# Patient Record
Sex: Female | Born: 1990 | Race: White | Hispanic: No | Marital: Married | Smoking: Never smoker
Health system: Southern US, Community
[De-identification: ages and names within clinical notes are randomized; demographics above are authoritative.]

## PROBLEM LIST (undated history)

## (undated) ENCOUNTER — Inpatient Hospital Stay (HOSPITAL_COMMUNITY): Payer: Self-pay

## (undated) DIAGNOSIS — N80109 Endometriosis of ovary, unspecified side, unspecified depth: Secondary | ICD-10-CM

## (undated) DIAGNOSIS — Z87440 Personal history of urinary (tract) infections: Secondary | ICD-10-CM

## (undated) DIAGNOSIS — T8859XA Other complications of anesthesia, initial encounter: Secondary | ICD-10-CM

## (undated) DIAGNOSIS — Z8709 Personal history of other diseases of the respiratory system: Secondary | ICD-10-CM

## (undated) DIAGNOSIS — Z87442 Personal history of urinary calculi: Secondary | ICD-10-CM

## (undated) DIAGNOSIS — Z8619 Personal history of other infectious and parasitic diseases: Secondary | ICD-10-CM

## (undated) DIAGNOSIS — F419 Anxiety disorder, unspecified: Secondary | ICD-10-CM

## (undated) DIAGNOSIS — N841 Polyp of cervix uteri: Secondary | ICD-10-CM

## (undated) DIAGNOSIS — Z9889 Other specified postprocedural states: Secondary | ICD-10-CM

## (undated) DIAGNOSIS — J45909 Unspecified asthma, uncomplicated: Secondary | ICD-10-CM

## (undated) DIAGNOSIS — K589 Irritable bowel syndrome without diarrhea: Secondary | ICD-10-CM

## (undated) DIAGNOSIS — Z8719 Personal history of other diseases of the digestive system: Secondary | ICD-10-CM

## (undated) DIAGNOSIS — R87629 Unspecified abnormal cytological findings in specimens from vagina: Secondary | ICD-10-CM

## (undated) DIAGNOSIS — N809 Endometriosis, unspecified: Secondary | ICD-10-CM

## (undated) DIAGNOSIS — T4145XA Adverse effect of unspecified anesthetic, initial encounter: Secondary | ICD-10-CM

## (undated) DIAGNOSIS — Q6210 Congenital occlusion of ureter, unspecified: Secondary | ICD-10-CM

## (undated) DIAGNOSIS — Z87448 Personal history of other diseases of urinary system: Secondary | ICD-10-CM

## (undated) DIAGNOSIS — N801 Endometriosis of ovary: Secondary | ICD-10-CM

## (undated) DIAGNOSIS — R112 Nausea with vomiting, unspecified: Secondary | ICD-10-CM

## (undated) HISTORY — DX: Personal history of other diseases of urinary system: Z87.448

## (undated) HISTORY — DX: Personal history of urinary calculi: Z87.442

## (undated) HISTORY — DX: Congenital occlusion of ureter, unspecified: Q62.10

## (undated) HISTORY — DX: Irritable bowel syndrome without diarrhea: K58.9

## (undated) HISTORY — DX: Personal history of other diseases of the digestive system: Z87.19

## (undated) HISTORY — DX: Endometriosis of ovary, unspecified side, unspecified depth: N80.109

## (undated) HISTORY — DX: Endometriosis of ovary: N80.1

## (undated) HISTORY — PX: DIAGNOSTIC LAPAROSCOPY: SUR761

## (undated) HISTORY — DX: Polyp of cervix uteri: N84.1

## (undated) HISTORY — PX: KIDNEY SURGERY: SHX687

## (undated) HISTORY — DX: Personal history of other diseases of the respiratory system: Z87.09

## (undated) HISTORY — DX: Personal history of urinary (tract) infections: Z87.440

## (undated) HISTORY — DX: Personal history of other infectious and parasitic diseases: Z86.19

## (undated) HISTORY — DX: Unspecified abnormal cytological findings in specimens from vagina: R87.629

---
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
## 2009-04-15 HISTORY — PX: APPENDECTOMY: SHX54

## 2009-04-15 HISTORY — PX: ABLATION ON ENDOMETRIOSIS: SHX5787

## 2013-02-16 ENCOUNTER — Ambulatory Visit (INDEPENDENT_AMBULATORY_CARE_PROVIDER_SITE_OTHER): Payer: Managed Care, Other (non HMO) | Admitting: Family Medicine

## 2013-02-16 ENCOUNTER — Encounter: Payer: Self-pay | Admitting: Family Medicine

## 2013-02-16 VITALS — BP 112/62 | HR 87 | Temp 98.9°F | Ht 65.0 in | Wt 133.5 lb

## 2013-02-16 DIAGNOSIS — K589 Irritable bowel syndrome without diarrhea: Secondary | ICD-10-CM | POA: Insufficient documentation

## 2013-02-16 DIAGNOSIS — Q613 Polycystic kidney, unspecified: Secondary | ICD-10-CM

## 2013-02-16 DIAGNOSIS — N200 Calculus of kidney: Secondary | ICD-10-CM | POA: Insufficient documentation

## 2013-02-16 DIAGNOSIS — N809 Endometriosis, unspecified: Secondary | ICD-10-CM

## 2013-02-16 DIAGNOSIS — R12 Heartburn: Secondary | ICD-10-CM | POA: Insufficient documentation

## 2013-02-16 DIAGNOSIS — N390 Urinary tract infection, site not specified: Secondary | ICD-10-CM

## 2013-02-16 NOTE — Patient Instructions (Signed)
Please send for last note from gyn (Dr Angeline Slim) and nephrology (Dr Earlene Plater)- both at [facility]  Call your nephrologist - ask about heartburn treatment ? Zantac  Watch diet - see the handout  Keep drinking lots of water

## 2013-02-16 NOTE — Progress Notes (Signed)
Subjective:    Patient ID: [name] [name], female    DOB: Jan 04, 1991, 22 y.o.   MRN: [id number]  HPI Here to establish for primary care  She lives across the street   Grew up in [place]  Has endometriosis- surgery 3 years ago (GYN in [place])- for endometriosis on her appendix / bladder/ovaries - has been improved since then  Did lupron/ depot for a while - had to go through menopause symptoms for a while  Then IUD - took it out last week  Uses the nuva ring - just started using it continuously   Mother had uterine cancer that spread to the ovaries   Has polycystic kidney disease- she was born with it  Many surgeries for this  Monitoring closely  Lot of kidney stones  (? Calcium) -now a new development - with repeated ureter blockage  One kidney does not fxn well (it never had) - the L side she thinks  R kidney functions normally as long as she does not get hydronephrosis (has a narrow ureter) She does have frequent utis -- her nephrologist keeps track of this - treated with amox  She "does not respond" to septra  Last couple of times - bacteria was staph   She drinks mostly water/ avoids other beverages/ urinates before and after intercourse / wipes front to back / (occ she holds urine too long)  Has baseline urgency and frequency   Also tends to have bad constipation - and this confuses her in terms of urinary symptoms  She was previously on amitiza - did not help  Usually diet makes the biggest difference -has seen a dietician    Does not have a urologist - just a nephrologist   Pap smear was in 2013 fall -will get one soon   Thinks she had a tetanus shot in college - 2009  Flu vaccine - had one 2 months ago   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 What she could take with renal issues  Tried zantac otc twice     There are no active problems to display for this patient.  Past Medical History  Diagnosis Date  . History of asthma     mild  . History of chicken pox   . History of hay fever   . History of kidney disease   . History of kidney stones   . History of UTI    Past Surgical History  Procedure Laterality Date  . Appendectomy  2011  . Ablation on endometriosis  2011  . Kidney surgery  1998, 2007, 2011    Blockage Removal  . Shoulder surgery  2007   History  Substance Use Topics  . Smoking status: Never Smoker   . Smokeless tobacco: Not on file  . Alcohol Use: Yes     Comment: wine -occ   Family History  Problem Relation Age of Onset  . Ovarian cancer Mother   . Uterine cancer Mother   . Stroke Father   . High blood pressure Father   . Sudden death Father   . Diabetes Maternal Grandmother    No Known Allergies No current outpatient prescriptions on file prior to visit.  No current facility-administered medications on file prior to visit.    Review of Systems Review of Systems  Constitutional: Negative for fever, appetite change, fatigue and unexpected weight change.  Eyes: Negative for pain and visual disturbance.  ent pos for pimple in nostril for which she is using bactroban ointment Respiratory: Negative for cough and shortness of breath.   Cardiovascular: Negative for cp or palpitations    Gastrointestinal: Negative for nausea, diarrhea and pos for constipation .pos for heartburn last night  Genitourinary: Negative for urgency and frequency. (but often has it) Skin: Negative for pallor or rash   Neurological: Negative for weakness, light-headedness, numbness and headaches.  Hematological: Negative for adenopathy. Does not bruise/bleed easily.  Psychiatric/Behavioral: Negative for dysphoric mood. The patient is not nervous/anxious.         Objective:   Physical Exam   Constitutional: She appears well-developed and well-nourished. No distress.  HENT:  Head: Normocephalic and atraumatic.  Right Ear: External ear normal.  Left Ear: External ear normal.  Nose: Nose normal.  Mouth/Throat: Oropharynx is clear and moist.  Small lesion in R nostril -not bleeding   Eyes: Conjunctivae and EOM are normal. Pupils are equal, round, and reactive to light. Right eye exhibits no discharge. Left eye exhibits no discharge. No scleral icterus.  Neck: Normal range of motion. Neck supple. No JVD present. No thyromegaly present.  Cardiovascular: Normal rate, regular rhythm, normal heart sounds and intact distal pulses.  Exam reveals no gallop.   Pulmonary/Chest: Effort normal and breath sounds normal. No respiratory distress. She has no wheezes. She has no rales.  Abdominal: Soft. Bowel sounds are normal. She exhibits no distension and no mass. There is no tenderness.  No cvs tenderness  Musculoskeletal: She exhibits no edema and no tenderness.  Lymphadenopathy:    She has no cervical adenopathy.  Neurological: She is alert. She has normal reflexes. No cranial nerve deficit. She exhibits normal muscle tone. Coordination normal.  Skin: Skin is warm and dry. No rash noted. No erythema. No pallor.  Psychiatric: She has a normal mood and affect.          Assessment & Plan:

## 2013-02-17 NOTE — Assessment & Plan Note (Signed)
Followed by nephrology-often tx with amox She has a cx pending now

## 2013-02-17 NOTE — Assessment & Plan Note (Signed)
I suspect zantac would be relatively safe but she will check with her renal doctor Diet given Will try not to eat late

## 2013-02-17 NOTE — Assessment & Plan Note (Signed)
Followed by gyn in [place] Sent for note  On nuva ring now

## 2013-02-17 NOTE — Assessment & Plan Note (Signed)
With narrow ureter and many stents  Cared for by nephrology

## 2013-02-17 NOTE — Assessment & Plan Note (Signed)
Currently stable  Hx of stones also  Followed closely by renal in [facility]

## 2013-02-17 NOTE — Assessment & Plan Note (Signed)
Constipation predominant -prev on amitiza No imp with miralax Stressed fluids and fiber ? If candidate for lizness in future in light of kidney issues

## 2013-08-04 ENCOUNTER — Emergency Department: Payer: Self-pay | Admitting: Internal Medicine

## 2013-08-04 LAB — URINALYSIS, COMPLETE
Bacteria: NONE SEEN
Bilirubin,UR: NEGATIVE
Glucose,UR: NEGATIVE mg/dL (ref 0–75)
Ketone: NEGATIVE
LEUKOCYTE ESTERASE: NEGATIVE
Nitrite: NEGATIVE
PH: 5 (ref 4.5–8.0)
Protein: NEGATIVE
RBC,UR: 2 /HPF (ref 0–5)
Specific Gravity: 1.012 (ref 1.003–1.030)
Squamous Epithelial: NONE SEEN

## 2013-08-04 LAB — COMPREHENSIVE METABOLIC PANEL
ALT: 26 U/L (ref 12–78)
Albumin: 4.7 g/dL (ref 3.4–5.0)
Alkaline Phosphatase: 61 U/L
Anion Gap: 4 — ABNORMAL LOW (ref 7–16)
BUN: 13 mg/dL (ref 7–18)
Bilirubin,Total: 0.8 mg/dL (ref 0.2–1.0)
CALCIUM: 9 mg/dL (ref 8.5–10.1)
CO2: 27 mmol/L (ref 21–32)
CREATININE: 0.59 mg/dL — AB (ref 0.60–1.30)
Chloride: 107 mmol/L (ref 98–107)
EGFR (African American): >60
EGFR (Non-African Amer.): >60
Glucose: 105 mg/dL — ABNORMAL HIGH (ref 65–99)
OSMOLALITY: 276 (ref 275–301)
Potassium: 4.4 mmol/L (ref 3.5–5.1)
SGOT(AST): 24 U/L (ref 15–37)
Sodium: 138 mmol/L (ref 136–145)
TOTAL PROTEIN: 8.1 g/dL (ref 6.4–8.2)

## 2013-08-04 LAB — CBC WITH DIFFERENTIAL/PLATELET
BASOS PCT: 0.5 %
Basophil #: 0 10*3/uL (ref 0.0–0.1)
EOS ABS: 0 10*3/uL (ref 0.0–0.7)
Eosinophil %: 0.7 %
HCT: 42.7 % (ref 35.0–47.0)
HGB: 14.5 g/dL (ref 12.0–16.0)
Lymphocyte #: 1.1 10*3/uL (ref 1.0–3.6)
Lymphocyte %: 19.9 %
MCH: 32.7 pg (ref 26.0–34.0)
MCHC: 34 g/dL (ref 32.0–36.0)
MCV: 96 fL (ref 80–100)
Monocyte #: 0.3 x10 3/mm (ref 0.2–0.9)
Monocyte %: 5.9 %
NEUTROS PCT: 73 %
Neutrophil #: 4.2 10*3/uL (ref 1.4–6.5)
PLATELETS: 174 10*3/uL (ref 150–440)
RBC: 4.43 10*6/uL (ref 3.80–5.20)
RDW: 11.7 % (ref 11.5–14.5)
WBC: 5.7 10*3/uL (ref 3.6–11.0)

## 2013-08-04 LAB — LIPASE, BLOOD: LIPASE: 161 U/L (ref 73–393)

## 2014-05-16 ENCOUNTER — Encounter: Payer: Self-pay | Admitting: Internal Medicine

## 2014-05-16 ENCOUNTER — Ambulatory Visit (INDEPENDENT_AMBULATORY_CARE_PROVIDER_SITE_OTHER): Payer: 59 | Admitting: Internal Medicine

## 2014-05-16 VITALS — BP 102/68 | HR 89 | Temp 98.3°F | Wt 137.0 lb

## 2014-05-16 DIAGNOSIS — J309 Allergic rhinitis, unspecified: Secondary | ICD-10-CM

## 2014-05-16 NOTE — Progress Notes (Addendum)
   Subjective:    Patient ID: [name] [name], female    DOB: 03/05/1991, 24 y.o.   MRN: [id number]  HPI   [name] [name] is a 24 year old female who presents today complaining of body aches, headaches and sinus issues. Symptoms began 4 days ago.  She denies congestion or sinus pain, but feels a burning sensation in her nose when breathing in. She is not blowing any mucous out of her nose. No cough, fever, but reports feeling fatigued.  She does report that she suffers from seasonal allergies and recently got a cat.  She has not taken anything for symptoms.      Review of Systems  Constitutional: Positive for fatigue. Negative for chills and diaphoresis.  HENT: Negative for congestion, ear pain, postnasal drip, rhinorrhea, sinus pressure and sore throat.   Respiratory: Negative for cough, choking, chest tightness, shortness of breath and wheezing.   Cardiovascular: Negative for chest pain, palpitations and leg swelling.  Gastrointestinal: Negative for nausea, abdominal pain and constipation.  Musculoskeletal: Positive for joint swelling. Negative for back pain and neck pain.  Skin: Negative for color change, pallor, rash and wound.  Neurological: Positive for headaches. Negative for dizziness, weakness and light-headedness.   Past Medical History  Diagnosis Date  . History of asthma     mild  . History of chicken pox   . History of hay fever   . History of kidney disease   . History of kidney stones   . History of UTI    Family History  Problem Relation Age of Onset  . Ovarian cancer Mother   . Uterine cancer Mother   . Stroke Father   . High blood pressure Father   . Sudden death Father   . Diabetes Maternal Grandmother    No current outpatient prescriptions on file prior to visit.   No current facility-administered medications on file prior to visit.        Objective:   Physical Exam  Constitutional: She is oriented to person, place, and time. She appears  well-developed and well-nourished.  HENT:  Head: Normocephalic and atraumatic.  Right Ear: External ear normal.  Left Ear: External ear normal.  Nose: Nose normal.  Mouth/Throat: Oropharynx is clear and moist. No oropharyngeal exudate.  Neck: Normal range of motion. Neck supple.  Cardiovascular: Normal rate, regular rhythm, normal heart sounds and intact distal pulses.   No murmur heard. Pulmonary/Chest: Effort normal and breath sounds normal. She has no wheezes.  Musculoskeletal: Normal range of motion.  Lymphadenopathy:    She has no cervical adenopathy.  Neurological: She is alert and oriented to person, place, and time.  Skin: Skin is warm and dry.  Psychiatric: She has a normal mood and affect.    BP 102/68 mmHg  Pulse 89  Temp(Src) 98.3 F (36.8 C) (Oral)  Wt 137 lb (62.143 kg)  SpO2 98%  LMP 05/09/2014       Assessment & Plan:   1. Allergic sinusitis - Advised patient to take zrytec OTC and Flonase nasal spray for symptoms.  Ibuprofen for headache.  Advised patient to call office if symptoms worsen or she develops fever.

## 2014-05-16 NOTE — Patient Instructions (Signed)

## 2014-05-16 NOTE — Progress Notes (Signed)
Pre visit review using our clinic review tool, if applicable. No additional management support is needed unless otherwise documented below in the visit note. 

## 2014-05-30 ENCOUNTER — Other Ambulatory Visit (HOSPITAL_COMMUNITY): Payer: Self-pay | Admitting: Physician Assistant

## 2014-05-30 DIAGNOSIS — N809 Endometriosis, unspecified: Secondary | ICD-10-CM

## 2014-06-03 ENCOUNTER — Other Ambulatory Visit (HOSPITAL_COMMUNITY): Payer: Self-pay | Admitting: Physician Assistant

## 2014-06-03 ENCOUNTER — Ambulatory Visit (HOSPITAL_COMMUNITY)
Admission: RE | Admit: 2014-06-03 | Discharge: 2014-06-03 | Disposition: A | Discharge status: Home / Self Care | Payer: 59 | Source: Ambulatory Visit | Attending: Physician Assistant | Admitting: Physician Assistant

## 2014-06-03 DIAGNOSIS — N809 Endometriosis, unspecified: Secondary | ICD-10-CM | POA: Diagnosis present

## 2014-06-03 DIAGNOSIS — N920 Excessive and frequent menstruation with regular cycle: Secondary | ICD-10-CM | POA: Diagnosis not present

## 2014-06-15 ENCOUNTER — Ambulatory Visit: Payer: 59 | Admitting: Internal Medicine

## 2014-07-28 ENCOUNTER — Emergency Department
Admit: 2014-07-28 | Disposition: A | Discharge status: Home / Self Care | Payer: Self-pay | Admitting: Emergency Medicine

## 2014-07-28 LAB — URINALYSIS, COMPLETE
Bilirubin,UR: NEGATIVE
Glucose,UR: NEGATIVE mg/dL (ref 0–75)
Ketone: NEGATIVE
LEUKOCYTE ESTERASE: NEGATIVE
Nitrite: NEGATIVE
PROTEIN: NEGATIVE
Ph: 8 (ref 4.5–8.0)
SPECIFIC GRAVITY: 1.012 (ref 1.003–1.030)

## 2014-10-04 ENCOUNTER — Encounter: Payer: Self-pay | Admitting: Family Medicine

## 2014-10-04 ENCOUNTER — Ambulatory Visit (INDEPENDENT_AMBULATORY_CARE_PROVIDER_SITE_OTHER): Payer: 59 | Admitting: Family Medicine

## 2014-10-04 VITALS — BP 98/66 | HR 71 | Temp 98.8°F | Ht 65.0 in | Wt 140.8 lb

## 2014-10-04 DIAGNOSIS — N390 Urinary tract infection, site not specified: Secondary | ICD-10-CM | POA: Diagnosis not present

## 2014-10-04 DIAGNOSIS — J069 Acute upper respiratory infection, unspecified: Secondary | ICD-10-CM

## 2014-10-04 DIAGNOSIS — R350 Frequency of micturition: Secondary | ICD-10-CM | POA: Insufficient documentation

## 2014-10-04 DIAGNOSIS — B9789 Other viral agents as the cause of diseases classified elsewhere: Principal | ICD-10-CM

## 2014-10-04 LAB — POCT URINALYSIS DIPSTICK
BILIRUBIN UA: NEGATIVE
Blood, UA: NEGATIVE
GLUCOSE UA: NEGATIVE
Ketones, UA: NEGATIVE
LEUKOCYTES UA: NEGATIVE
NITRITE UA: NEGATIVE
Protein, UA: NEGATIVE
Spec Grav, UA: >=1.03
UROBILINOGEN UA: 0.2
pH, UA: 6

## 2014-10-04 NOTE — Assessment & Plan Note (Signed)
Send urine for culture given pt history.

## 2014-10-04 NOTE — Progress Notes (Signed)
   Subjective:    Patient ID: [name] [name], female    DOB: 1991/01/20, 24 y.o.   MRN: [id number]  Cough This is a new problem. The current episode started in the past 7 days (in last 4 days). The problem has been gradually worsening. The cough is non-productive. Associated symptoms include ear congestion, headaches, nasal congestion, postnasal drip, rhinorrhea and a sore throat. Pertinent negatives include no chills, ear pain, fever, shortness of breath or wheezing. Associated symptoms comments: Green nasal discharge.  sinus pressure,  Left upper tooth pain. The symptoms are aggravated by lying down. Risk factors: nonsmoker. Treatments tried: nyquil. Her past medical history is significant for asthma and environmental allergies. There is no history of bronchiectasis, COPD, emphysema or pneumonia. mild intermittant asthma as child   She has polycystic kidney disease, bladder tenderness and frequent UTIs.  She has been having increased frequency and urgency in last week. IN last few days she has mild dysuria.  No fever. No flank pain.   Some bladder tenderness.  She has Bactrim from urologist to use if needed.  She request UA and culture today.    Review of Systems  Constitutional: Negative for fever and chills.  HENT: Positive for postnasal drip, rhinorrhea and sore throat. Negative for ear pain.   Respiratory: Positive for cough. Negative for shortness of breath and wheezing.   Musculoskeletal: Positive for arthralgias.  Allergic/Immunologic: Positive for environmental allergies.  Neurological: Positive for headaches.       Objective:   Physical Exam  Constitutional: Vital signs are normal. She appears well-developed and well-nourished. She is cooperative.  Non-toxic appearance. She does not appear ill. No distress.  HENT:  Head: Normocephalic.  Right Ear: Hearing, tympanic membrane, external ear and ear canal normal. Tympanic membrane is not erythematous, not retracted and not  bulging.  Left Ear: Hearing, tympanic membrane, external ear and ear canal normal. Tympanic membrane is not erythematous, not retracted and not bulging.  Nose: Mucosal edema and rhinorrhea present. Right sinus exhibits no maxillary sinus tenderness and no frontal sinus tenderness. Left sinus exhibits no maxillary sinus tenderness and no frontal sinus tenderness.  Mouth/Throat: Uvula is midline, oropharynx is clear and moist and mucous membranes are normal.  Eyes: Conjunctivae, EOM and lids are normal. Pupils are equal, round, and reactive to light. Lids are everted and swept, no foreign bodies found.  Neck: Trachea normal and normal range of motion. Neck supple. Carotid bruit is not present. No thyroid mass and no thyromegaly present.  Cardiovascular: Normal rate, regular rhythm, S1 normal, S2 normal, normal heart sounds, intact distal pulses and normal pulses.  Exam reveals no gallop and no friction rub.   No murmur heard. Pulmonary/Chest: Effort normal and breath sounds normal. No tachypnea. No respiratory distress. She has no decreased breath sounds. She has no wheezes. She has no rhonchi. She has no rales.  Abdominal: Normal appearance and bowel sounds are normal. There is no tenderness. There is no CVA tenderness.  Neurological: She is alert.  Skin: Skin is warm, dry and intact. No rash noted.  Psychiatric: Her speech is normal and behavior is normal. Judgment normal. Her mood appears not anxious. Cognition and memory are normal. She does not exhibit a depressed mood.          Assessment & Plan:

## 2014-10-04 NOTE — Patient Instructions (Addendum)
Start  Nasal saline spray 2-3 times a day. Start mucinex Dm twice daily.  Start flonase 2 sprays per nostril.   Call if not improving in 7-10 days, or fever.  We will call with urine culture results.

## 2014-10-04 NOTE — Progress Notes (Signed)
Pre visit review using our clinic review tool, if applicable. No additional management support is needed unless otherwise documented below in the visit note. 

## 2014-10-04 NOTE — Addendum Note (Signed)
Addended by: Shon Millet on: 10/04/2014 08:44 AM   Modules accepted: Orders

## 2014-10-04 NOTE — Assessment & Plan Note (Signed)
No clear indication for antibiotics.  Treat with mucolytic, nasal saline and steroid.

## 2014-10-05 LAB — URINE CULTURE
Colony Count: NO GROWTH
Organism ID, Bacteria: NO GROWTH

## 2014-12-01 ENCOUNTER — Ambulatory Visit (INDEPENDENT_AMBULATORY_CARE_PROVIDER_SITE_OTHER): Payer: 59 | Admitting: Primary Care

## 2014-12-01 ENCOUNTER — Encounter: Payer: Self-pay | Admitting: Primary Care

## 2014-12-01 VITALS — BP 106/70 | HR 89 | Temp 98.1°F | Ht 65.0 in | Wt 138.8 lb

## 2014-12-01 DIAGNOSIS — J029 Acute pharyngitis, unspecified: Secondary | ICD-10-CM

## 2014-12-01 LAB — POCT RAPID STREP A (OFFICE): RAPID STREP A SCREEN: NEGATIVE

## 2014-12-01 NOTE — Addendum Note (Signed)
Addended by: Tawnya Crook on: 12/01/2014 02:45 PM   Modules accepted: Orders

## 2014-12-01 NOTE — Patient Instructions (Signed)
You did not test positive for strep.  Your symptoms are likely due to a virus which will improve steadily over the next several days.  Push intake of water and take tylenol for pain and further fevers.  It was a pleasure meeting you!

## 2014-12-01 NOTE — Progress Notes (Signed)
Pre visit review using our clinic review tool, if applicable. No additional management support is needed unless otherwise documented below in the visit note. 

## 2014-12-01 NOTE — Progress Notes (Signed)
Subjective:    Patient ID: [name] [name], female    DOB: 1991-01-26, 24 y.o.   MRN: [id number]  HPI  [name] [name] is a 24 year old female who presents today with a chief complaint of sore throat. Her throat has been sore for the past 3-4 days with worsening pain this morning. She also reports symptoms of dry nose and has bumps in her nose. She was diagnosed with MRSA in her nose. She's been using saline nasal spray and applying medicated ointment to her nares. Now she's feeling better. She's also [redacted] weeks pregnant. She was exposed to two women who tested positive for strep.  Review of Systems  Constitutional: Negative for chills.       Low grade fever 1 week ago, dissipated that day.  HENT: Positive for postnasal drip and sore throat. Negative for congestion and sinus pressure.   Respiratory: Negative for cough and shortness of breath.   Cardiovascular: Negative for chest pain.  Musculoskeletal: Negative for myalgias.       Past Medical History  Diagnosis Date  . History of asthma     mild  . History of chicken pox   . History of hay fever   . History of kidney disease   . History of kidney stones   . History of UTI     Social History   Social History  . Marital Status: Married    Spouse Name: N/A  . Number of Children: N/A  . Years of Education: N/A   Occupational History  . Not on file.   Social History Main Topics  . Smoking status: Never Smoker   . Smokeless tobacco: Not on file  . Alcohol Use: 0.0 oz/week    0 Standard drinks or equivalent per week     Comment: wine -occ  . Drug Use: No  . Sexual Activity: Not on file   Other Topics Concern  . Not on file   Social History Narrative    Past Surgical History  Procedure Laterality Date  . Appendectomy  2011  . Ablation on endometriosis  2011  . Kidney surgery  1998, 2007, 2011    Blockage Removal  . Shoulder surgery  2007    Family History  Problem Relation Age of Onset  . Ovarian cancer Mother    . Uterine cancer Mother   . Stroke Father   . High blood pressure Father   . Sudden death Father   . Diabetes Maternal Grandmother     No Known Allergies  Current Outpatient Prescriptions on File Prior to Visit  Medication Sig Dispense Refill  . Prenatal Vit-Fe Fumarate-FA (PRENATAL MULTIVITAMIN) TABS tablet Take 1 tablet by mouth daily at 12 noon.     No current facility-administered medications on file prior to visit.    BP 106/70 mmHg  Pulse 89  Temp(Src) 98.1 F (36.7 C) (Oral)  Ht  (1.651 m)  Wt 138 lb 12.8 oz (62.959 kg)  BMI 23.10 kg/m2  SpO2 98%  LMP 10/03/2014    Objective:   Physical Exam  Constitutional: She appears well-nourished. She does not appear ill.  HENT:  Right Ear: Tympanic membrane and ear canal normal.  Left Ear: Tympanic membrane and ear canal normal.  Nose: Right sinus exhibits no maxillary sinus tenderness and no frontal sinus tenderness. Left sinus exhibits no maxillary sinus tenderness and no frontal sinus tenderness.  Mouth/Throat: Posterior oropharyngeal erythema present. No oropharyngeal exudate or posterior oropharyngeal edema.  Eyes: Conjunctivae  are normal. Pupils are equal, round, and reactive to light.  Neck: Neck supple.  Cardiovascular: Normal rate and regular rhythm.   Pulmonary/Chest: Effort normal and breath sounds normal.  Lymphadenopathy:    She has no cervical adenopathy.  Skin: Skin is warm and dry.          Assessment & Plan:  Sore throat:  Present for 3-4 days, worsening pain this morning, now feeling better. Currently [redacted] weeks pregnant Using nasal saline spray and ointment. 1 low grade fever last week. Rapid strep: Negative Push fluids. Tylenol for pain and any further fever.

## 2014-12-11 LAB — OB RESULTS CONSOLE RPR: RPR: NONREACTIVE

## 2014-12-11 LAB — OB RESULTS CONSOLE GC/CHLAMYDIA
CHLAMYDIA, DNA PROBE: NEGATIVE
GC PROBE AMP, GENITAL: NEGATIVE

## 2014-12-11 LAB — OB RESULTS CONSOLE ANTIBODY SCREEN: ANTIBODY SCREEN: NEGATIVE

## 2014-12-11 LAB — OB RESULTS CONSOLE ABO/RH: RH Type: NEGATIVE

## 2014-12-11 LAB — OB RESULTS CONSOLE HEPATITIS B SURFACE ANTIGEN: HEP B S AG: NEGATIVE

## 2014-12-11 LAB — OB RESULTS CONSOLE HIV ANTIBODY (ROUTINE TESTING): HIV: NONREACTIVE

## 2014-12-11 LAB — OB RESULTS CONSOLE RUBELLA ANTIBODY, IGM: Rubella: NON-IMMUNE/NOT IMMUNE

## 2015-03-31 ENCOUNTER — Encounter: Payer: Self-pay | Admitting: Family Medicine

## 2015-03-31 ENCOUNTER — Ambulatory Visit (INDEPENDENT_AMBULATORY_CARE_PROVIDER_SITE_OTHER): Payer: 59 | Admitting: Family Medicine

## 2015-03-31 VITALS — BP 110/60 | HR 92 | Temp 97.8°F | Wt 152.5 lb

## 2015-03-31 DIAGNOSIS — B9789 Other viral agents as the cause of diseases classified elsewhere: Principal | ICD-10-CM

## 2015-03-31 DIAGNOSIS — J069 Acute upper respiratory infection, unspecified: Secondary | ICD-10-CM | POA: Diagnosis not present

## 2015-03-31 MED ORDER — AMOXICILLIN 250 MG/5ML PO SUSR: 500.0000 mg | Freq: Three times a day (TID) | ORAL | Status: DC

## 2015-03-31 NOTE — Patient Instructions (Signed)
Continue the robitussin  Tylenol if needed Drink fluids Breathe steam for congestion  Zyrtec 5 mg daily is ok for runny nose if needed  If you get sudden increase in ear pain and fever - take the amoxicillin and let us know    Update if not starting to improve in a week or if worsening

## 2015-03-31 NOTE — Assessment & Plan Note (Signed)
In pregnant pt - hx of OM Has ETD on the R -no infx   (px amox in case she gets sudden worse ear pain over the wkend)  Disc symptomatic care - see instructions on AVS  Update if not starting to improve in a week or if worsening   Reassuring exam

## 2015-03-31 NOTE — Progress Notes (Signed)
Pre visit review using our clinic review tool, if applicable. No additional management support is needed unless otherwise documented below in the visit note. 

## 2015-03-31 NOTE — Progress Notes (Signed)
Subjective:    Patient ID: [name] [name], female    DOB: 1990-05-20, 24 y.o.   MRN: [id number]  HPI Here with uri symptoms   2 -3 days Congestion /runny nose Throat and ear pain   Working long hours   Has had some chills and aches  Suspects low grade temp -unsure - just felt like it   Took 1 tylenol for ear pain  Robitussin DM -- suggested by OB   Is pregnant = due in March  Patient Active Problem List   Diagnosis Date Noted  . Viral URI with cough 10/04/2014  . Urinary frequency 10/04/2014  . Heartburn 02/16/2013  . Polycystic kidney 02/16/2013  . Kidney stones 02/16/2013  . Frequent UTI 02/16/2013  . IBS (irritable bowel syndrome) 02/16/2013  . Endometriosis 02/16/2013   Past Medical History  Diagnosis Date  . History of asthma     mild  . History of chicken pox   . History of hay fever   . History of kidney disease   . History of kidney stones   . History of UTI    Past Surgical History  Procedure Laterality Date  . Appendectomy  2011  . Ablation on endometriosis  2011  . Kidney surgery  1998, 2007, 2011    Blockage Removal  . Shoulder surgery  2007   Social History  Substance Use Topics  . Smoking status: Never Smoker   . Smokeless tobacco: None  . Alcohol Use: 0.0 oz/week    0 Standard drinks or equivalent per week     Comment: wine -occ   Family History  Problem Relation Age of Onset  . Ovarian cancer Mother   . Uterine cancer Mother   . Stroke Father   . High blood pressure Father   . Sudden death Father   . Diabetes Maternal Grandmother    No Known Allergies Current Outpatient Prescriptions on File Prior to Visit  Medication Sig Dispense Refill  . Prenatal Vit-Fe Fumarate-FA (PRENATAL MULTIVITAMIN) TABS tablet Take 1 tablet by mouth daily at 12 noon.     No current facility-administered medications on file prior to visit.    Review of Systems Review of Systems  Constitutional: Negative for fever, appetite change, fatigue and  unexpected weight change. pos for pregnancy ENT pos for cong/rhinorrhea and ear fullness Eyes: Negative for pain and visual disturbance.  Respiratory: Negative for wheeze and shortness of breath.   Cardiovascular: Negative for cp or palpitations    Gastrointestinal: Negative for nausea, diarrhea and constipation.  Genitourinary: Negative for urgency and frequency.  Skin: Negative for pallor or rash   Neurological: Negative for weakness, light-headedness, numbness and headaches.  Hematological: Negative for adenopathy. Does not bruise/bleed easily.  Psychiatric/Behavioral: Negative for dysphoric mood. The patient is not nervous/anxious.         Objective:   Physical Exam  Constitutional: She appears well-developed and well-nourished. No distress.  HENT:  Head: Normocephalic and atraumatic.  Right Ear: External ear normal.  Left Ear: External ear normal.  Mouth/Throat: Oropharynx is clear and moist.  Nares are injected and congested  No sinus tenderness Clear rhinorrhea and post nasal drip  TMs are dull bilat   Eyes: Conjunctivae and EOM are normal. Pupils are equal, round, and reactive to light. Right eye exhibits no discharge. Left eye exhibits no discharge.  Neck: Normal range of motion. Neck supple.  Cardiovascular: Normal rate and normal heart sounds.   Pulmonary/Chest: Effort normal and breath sounds normal. No  respiratory distress. She has no wheezes. She has no rales. She exhibits no tenderness.  Lymphadenopathy:    She has no cervical adenopathy.  Neurological: She is alert.  Skin: Skin is warm and dry. No rash noted.  Psychiatric: She has a normal mood and affect.          Assessment & Plan:   Problem List Items Addressed This Visit      Respiratory   Viral URI with cough - Primary    In pregnant pt - hx of OM Has ETD on the R -no infx   (px amox in case she gets sudden worse ear pain over the wkend)  Disc symptomatic care - see instructions on AVS  Update if  not starting to improve in a week or if worsening   Reassuring exam

## 2015-05-19 ENCOUNTER — Encounter: Payer: Self-pay | Admitting: Primary Care

## 2015-05-19 ENCOUNTER — Ambulatory Visit (INDEPENDENT_AMBULATORY_CARE_PROVIDER_SITE_OTHER): Payer: 59 | Admitting: Primary Care

## 2015-05-19 VITALS — BP 120/72 | HR 81 | Temp 97.8°F | Ht 65.0 in | Wt 160.8 lb

## 2015-05-19 DIAGNOSIS — R05 Cough: Secondary | ICD-10-CM

## 2015-05-19 DIAGNOSIS — R059 Cough, unspecified: Secondary | ICD-10-CM

## 2015-05-19 MED ORDER — AMOXICILLIN 500 MG PO CAPS: 500.0000 mg | ORAL_CAPSULE | Freq: Two times a day (BID) | ORAL | Status: DC

## 2015-05-19 NOTE — Progress Notes (Signed)
Pre visit review using our clinic review tool, if applicable. No additional management support is needed unless otherwise documented below in the visit note. 

## 2015-05-19 NOTE — Progress Notes (Signed)
Subjective:    Patient ID: [name] [name], female    DOB: 1991/03/23, 25 y.o.   MRN: [id number]  HPI  Ms. [name] [name] is a 25 year old female who presents today with a chief complaint of cough. She also reports fevers, nasal congestion, sore throat, ear pain, chills. Her symptoms have been present for the past 7 days. This is the third time she's experienced these symptoms during her pregnancy. This morning she woke up with less congestion overall. She's taken tylenol and Robitussin DM with some improvement. Denies sick contacts.   Review of Systems  Constitutional: Positive for fever, chills and fatigue.  HENT: Positive for congestion, ear pain, postnasal drip and sinus pressure.   Respiratory: Positive for cough. Negative for shortness of breath and wheezing.   Cardiovascular: Negative for chest pain.       Past Medical History  Diagnosis Date  . History of asthma     mild  . History of chicken pox   . History of hay fever   . History of kidney disease   . History of kidney stones   . History of UTI     Social History   Social History  . Marital Status: Married    Spouse Name: N/A  . Number of Children: N/A  . Years of Education: N/A   Occupational History  . Not on file.   Social History Main Topics  . Smoking status: Never Smoker   . Smokeless tobacco: Not on file  . Alcohol Use: 0.0 oz/week    0 Standard drinks or equivalent per week     Comment: wine -occ  . Drug Use: No  . Sexual Activity: Not on file   Other Topics Concern  . Not on file   Social History Narrative    Past Surgical History  Procedure Laterality Date  . Appendectomy  2011  . Ablation on endometriosis  2011  . Kidney surgery  1998, 2007, 2011    Blockage Removal  . Shoulder surgery  2007    Family History  Problem Relation Age of Onset  . Ovarian cancer Mother   . Uterine cancer Mother   . Stroke Father   . High blood pressure Father   . Sudden death Father   . Diabetes  Maternal Grandmother     No Known Allergies  Current Outpatient Prescriptions on File Prior to Visit  Medication Sig Dispense Refill  . Prenatal Vit-Fe Fumarate-FA (PRENATAL MULTIVITAMIN) TABS tablet Take 1 tablet by mouth daily at 12 noon.     No current facility-administered medications on file prior to visit.    BP 120/72 mmHg  Pulse 81  Temp(Src) 97.8 F (36.6 C) (Oral)  Ht  (1.651 m)  Wt 160 lb 12.8 oz (72.938 kg)  BMI 26.76 kg/m2  SpO2 98%  LMP 10/03/2014    Objective:   Physical Exam  Constitutional: She appears well-nourished.  HENT:  Right Ear: Tympanic membrane and ear canal normal.  Left Ear: Tympanic membrane and ear canal normal.  Nose: Right sinus exhibits maxillary sinus tenderness. Right sinus exhibits no frontal sinus tenderness. Left sinus exhibits maxillary sinus tenderness. Left sinus exhibits no frontal sinus tenderness.  Mouth/Throat: Oropharynx is clear and moist.  Eyes: Conjunctivae are normal.  Neck: Neck supple.  Cardiovascular: Normal rate and regular rhythm.   Pulmonary/Chest: Effort normal and breath sounds normal. She has no wheezes. She has no rales.  Lymphadenopathy:    She has no cervical adenopathy.  Assessment & Plan:  URI:  Cough, fatigue, sore throat, ear pain x 7 days.  Starting to feel slightly improved from yesterday. Some improvement with OTC treatment. Exam with clear lungs, nasal mucosal edema, vitals stable. Suspect viral involvement at this point and will continue with supportive treatment. Zyrtec, Robitussin DM, Flonase, fluids. RX for Amoxil printed for patient to fill if no improvement.  Return precautions provided.

## 2015-05-19 NOTE — Patient Instructions (Signed)
Hang onto the Amoxicillin prescription and fill if you start to feel worse. Try to refrain from filling the antibiotic for several days if possible.  Start Zyrtec, continue Robitussin.  Nasal Congestion: Try using Flonase (fluticasone) nasal spray. Instill 2 sprays in each nostril once daily.   Increase consumption of water and rest.  It was a pleasure meeting you!  Upper Respiratory Infection, Adult Most upper respiratory infections (URIs) are a viral infection of the air passages leading to the lungs. A URI affects the nose, throat, and upper air passages. The most common type of URI is nasopharyngitis and is typically referred to as "the common cold." URIs run their course and usually go away on their own. Most of the time, a URI does not require medical attention, but sometimes a bacterial infection in the upper airways can follow a viral infection. This is called a secondary infection. Sinus and middle ear infections are common types of secondary upper respiratory infections. Bacterial pneumonia can also complicate a URI. A URI can worsen asthma and chronic obstructive pulmonary disease (COPD). Sometimes, these complications can require emergency medical care and may be life threatening.  CAUSES Almost all URIs are caused by viruses. A virus is a type of germ and can spread from one person to another.  RISKS FACTORS You may be at risk for a URI if:   You smoke.   You have chronic heart or lung disease.  You have a weakened defense (immune) system.   You are very young or very old.   You have nasal allergies or asthma.  You work in crowded or poorly ventilated areas.  You work in health care facilities or schools. SIGNS AND SYMPTOMS  Symptoms typically develop 2-3 days after you come in contact with a cold virus. Most viral URIs last 7-10 days. However, viral URIs from the influenza virus (flu virus) can last 14-18 days and are typically more severe. Symptoms may include:    Runny or stuffy (congested) nose.   Sneezing.   Cough.   Sore throat.   Headache.   Fatigue.   Fever.   Loss of appetite.   Pain in your forehead, behind your eyes, and over your cheekbones (sinus pain).  Muscle aches.  DIAGNOSIS  Your health care provider may diagnose a URI by:  Physical exam.  Tests to check that your symptoms are not due to another condition such as:  Strep throat.  Sinusitis.  Pneumonia.  Asthma. TREATMENT  A URI goes away on its own with time. It cannot be cured with medicines, but medicines may be prescribed or recommended to relieve symptoms. Medicines may help:  Reduce your fever.  Reduce your cough.  Relieve nasal congestion. HOME CARE INSTRUCTIONS   Take medicines only as directed by your health care provider.   Gargle warm saltwater or take cough drops to comfort your throat as directed by your health care provider.  Use a warm mist humidifier or inhale steam from a shower to increase air moisture. This may make it easier to breathe.  Drink enough fluid to keep your urine clear or pale yellow.   Eat soups and other clear broths and maintain good nutrition.   Rest as needed.   Return to work when your temperature has returned to normal or as your health care provider advises. You may need to stay home longer to avoid infecting others. You can also use a face mask and careful hand washing to prevent spread of the virus.  Increase the usage of your inhaler if you have asthma.   Do not use any tobacco products, including cigarettes, chewing tobacco, or electronic cigarettes. If you need help quitting, ask your health care provider. PREVENTION  The best way to protect yourself from getting a cold is to practice good hygiene.   Avoid oral or hand contact with people with cold symptoms.   Wash your hands often if contact occurs.  There is no clear evidence that vitamin C, vitamin E, echinacea, or exercise  reduces the chance of developing a cold. However, it is always recommended to get plenty of rest, exercise, and practice good nutrition.  SEEK MEDICAL CARE IF:   You are getting worse rather than better.   Your symptoms are not controlled by medicine.   You have chills.  You have worsening shortness of breath.  You have brown or red mucus.  You have yellow or brown nasal discharge.  You have pain in your face, especially when you bend forward.  You have a fever.  You have swollen neck glands.  You have pain while swallowing.  You have white areas in the back of your throat. SEEK IMMEDIATE MEDICAL CARE IF:   You have severe or persistent:  Headache.  Ear pain.  Sinus pain.  Chest pain.  You have chronic lung disease and any of the following:  Wheezing.  Prolonged cough.  Coughing up blood.  A change in your usual mucus.  You have a stiff neck.  You have changes in your:  Vision.  Hearing.  Thinking.  Mood. MAKE SURE YOU:   Understand these instructions.  Will watch your condition.  Will get help right away if you are not doing well or get worse.   This information is not intended to replace advice given to you by your health care provider. Make sure you discuss any questions you have with your health care provider.   Document Released: 09/25/2000 Document Revised: 08/16/2014 Document Reviewed: 07/07/2013 Elsevier Interactive Patient Education [employer]

## 2015-06-09 ENCOUNTER — Encounter (HOSPITAL_COMMUNITY): Payer: Self-pay | Admitting: *Deleted

## 2015-06-09 ENCOUNTER — Inpatient Hospital Stay (HOSPITAL_COMMUNITY)
Admission: AD | Admit: 2015-06-09 | Discharge: 2015-06-09 | Disposition: A | Discharge status: Home / Self Care | Payer: 59 | Source: Ambulatory Visit | Attending: Obstetrics & Gynecology | Admitting: Obstetrics & Gynecology

## 2015-06-09 DIAGNOSIS — Z87442 Personal history of urinary calculi: Secondary | ICD-10-CM | POA: Diagnosis not present

## 2015-06-09 DIAGNOSIS — K921 Melena: Secondary | ICD-10-CM | POA: Insufficient documentation

## 2015-06-09 DIAGNOSIS — O26893 Other specified pregnancy related conditions, third trimester: Secondary | ICD-10-CM | POA: Insufficient documentation

## 2015-06-09 DIAGNOSIS — Z79899 Other long term (current) drug therapy: Secondary | ICD-10-CM | POA: Diagnosis not present

## 2015-06-09 DIAGNOSIS — Z9889 Other specified postprocedural states: Secondary | ICD-10-CM | POA: Insufficient documentation

## 2015-06-09 DIAGNOSIS — O99613 Diseases of the digestive system complicating pregnancy, third trimester: Secondary | ICD-10-CM

## 2015-06-09 DIAGNOSIS — J45909 Unspecified asthma, uncomplicated: Secondary | ICD-10-CM | POA: Diagnosis not present

## 2015-06-09 DIAGNOSIS — K5900 Constipation, unspecified: Secondary | ICD-10-CM | POA: Insufficient documentation

## 2015-06-09 DIAGNOSIS — Z3A35 35 weeks gestation of pregnancy: Secondary | ICD-10-CM | POA: Diagnosis not present

## 2015-06-09 HISTORY — DX: Adverse effect of unspecified anesthetic, initial encounter: T41.45XA

## 2015-06-09 HISTORY — DX: Other specified postprocedural states: Z98.890

## 2015-06-09 HISTORY — DX: Other complications of anesthesia, initial encounter: T88.59XA

## 2015-06-09 HISTORY — DX: Endometriosis, unspecified: N80.9

## 2015-06-09 HISTORY — DX: Nausea with vomiting, unspecified: R11.2

## 2015-06-09 HISTORY — DX: Unspecified asthma, uncomplicated: J45.909

## 2015-06-09 MED ORDER — DOCUSATE SODIUM 100 MG PO CAPS: 100.0000 mg | ORAL_CAPSULE | Freq: Two times a day (BID) | ORAL | Status: DC

## 2015-06-09 NOTE — Discharge Instructions (Signed)
Constipation, Adult °Constipation is when a person has fewer than three bowel movements a week, has difficulty having a bowel movement, or has stools that are dry, hard, or larger than normal. As people grow older, constipation is more common. A low-fiber diet, not taking in enough fluids, and taking certain medicines may make constipation worse.  °CAUSES  °· Certain medicines, such as antidepressants, pain medicine, iron supplements, antacids, and water pills.   °· Certain diseases, such as diabetes, irritable bowel syndrome (IBS), thyroid disease, or depression.   °· Not drinking enough water.   °· Not eating enough fiber-rich foods.   °· Stress or travel.   °· Lack of physical activity or exercise.   °· Ignoring the urge to have a bowel movement.   °· Using laxatives too much.   °SIGNS AND SYMPTOMS  °· Having fewer than three bowel movements a week.   °· Straining to have a bowel movement.   °· Having stools that are hard, dry, or larger than normal.   °· Feeling full or bloated.   °· Pain in the lower abdomen.   °· Not feeling relief after having a bowel movement.   °DIAGNOSIS  °Your health care provider will take a medical history and perform a physical exam. Further testing may be done for severe constipation. Some tests may include: °· A barium enema X-ray to examine your rectum, colon, and, sometimes, your small intestine.   °· A sigmoidoscopy to examine your lower colon.   °· A colonoscopy to examine your entire colon. °TREATMENT  °Treatment will depend on the severity of your constipation and what is causing it. Some dietary treatments include drinking more fluids and eating more fiber-rich foods. Lifestyle treatments may include regular exercise. If these diet and lifestyle recommendations do not help, your health care provider may recommend taking over-the-counter laxative medicines to help you have bowel movements. Prescription medicines may be prescribed if over-the-counter medicines do not work.    °HOME CARE INSTRUCTIONS  °· Eat foods that have a lot of fiber, such as fruits, vegetables, whole grains, and beans. °· Limit foods high in fat and processed sugars, such as french fries, hamburgers, cookies, candies, and soda.   °· A fiber supplement may be added to your diet if you cannot get enough fiber from foods.   °· Drink enough fluids to keep your urine clear or pale yellow.   °· Exercise regularly or as directed by your health care provider.   °· Go to the restroom when you have the urge to go. Do not hold it.   °· Only take over-the-counter or prescription medicines as directed by your health care provider. Do not take other medicines for constipation without talking to your health care provider first.   °SEEK IMMEDIATE MEDICAL CARE IF:  °· You have bright red blood in your stool.   °· Your constipation lasts for more than 4 days or gets worse.   °· You have abdominal or rectal pain.   °· You have thin, pencil-like stools.   °· You have unexplained weight loss. °MAKE SURE YOU:  °· Understand these instructions. °· Will watch your condition. °· Will get help right away if you are not doing well or get worse. °  °This information is not intended to replace advice given to you by your health care provider. Make sure you discuss any questions you have with your health care provider. °  °Document Released: 12/29/2003 Document Revised: 04/22/2014 Document Reviewed: 01/11/2013 °Elsevier Interactive Patient Education ©2016 Elsevier Inc. ° °High-Fiber Diet °Fiber, also called dietary fiber, is a type of carbohydrate found in fruits, vegetables, whole grains, and   beans. A high-fiber diet can have many health benefits. Your health care provider may recommend a high-fiber diet to help: °· Prevent constipation. Fiber can make your bowel movements more regular. °· Lower your cholesterol. °· Relieve hemorrhoids, uncomplicated diverticulosis, or irritable bowel syndrome. °· Prevent overeating as part of a weight-loss  plan. °· Prevent heart disease, type 2 diabetes, and certain cancers. °WHAT IS MY PLAN? °The recommended daily intake of fiber includes: °· 38 grams for men under age 50. °· 30 grams for men over age 50. °· 25 grams for women under age 50. °· 21 grams for women over age 50. °You can get the recommended daily intake of dietary fiber by eating a variety of fruits, vegetables, grains, and beans. Your health care provider may also recommend a fiber supplement if it is not possible to get enough fiber through your diet. °WHAT DO I NEED TO KNOW ABOUT A HIGH-FIBER DIET? °· Fiber supplements have not been widely studied for their effectiveness, so it is better to get fiber through food sources. °· Always check the fiber content on the nutrition facts label of any prepackaged food. Look for foods that contain at least 5 grams of fiber per serving. °· Ask your dietitian if you have questions about specific foods that are related to your condition, especially if those foods are not listed in the following section. °· Increase your daily fiber consumption gradually. Increasing your intake of dietary fiber too quickly may cause bloating, cramping, or gas. °· Drink plenty of water. Water helps you to digest fiber. °WHAT FOODS CAN I EAT? °Grains °Whole-grain breads. Multigrain cereal. Oats and oatmeal. Brown rice. Barley. Bulgur wheat. Millet. Bran muffins. Popcorn. Rye wafer crackers. °Vegetables °Sweet potatoes. Spinach. Kale. Artichokes. Cabbage. Broccoli. Green peas. Carrots. Squash. °Fruits °Berries. Pears. Apples. Oranges. Avocados. Prunes and raisins. Dried figs. °Meats and Other Protein Sources °Navy, kidney, pinto, and soy beans. Split peas. Lentils. Nuts and seeds. °Dairy °Fiber-fortified yogurt. °Beverages °Fiber-fortified soy milk. Fiber-fortified orange juice. °Other °Fiber bars. °The items listed above may not be a complete list of recommended foods or beverages. Contact your dietitian for more options. °WHAT FOODS  ARE NOT RECOMMENDED? °Grains °White bread. Pasta made with refined flour. White rice. °Vegetables °Fried potatoes. Canned vegetables. Well-cooked vegetables.  °Fruits °Fruit juice. Cooked, strained fruit. °Meats and Other Protein Sources °Fatty cuts of meat. Fried poultry or fried fish. °Dairy °Milk. Yogurt. Cream cheese. Sour cream. °Beverages °Soft drinks. °Other °Cakes and pastries. Butter and oils. °The items listed above may not be a complete list of foods and beverages to avoid. Contact your dietitian for more information. °WHAT ARE SOME TIPS FOR INCLUDING HIGH-FIBER FOODS IN MY DIET? °· Eat a wide variety of high-fiber foods. °· Make sure that half of all grains consumed each day are whole grains. °· Replace breads and cereals made from refined flour or white flour with whole-grain breads and cereals. °· Replace white rice with brown rice, bulgur wheat, or millet. °· Start the day with a breakfast that is high in fiber, such as a cereal that contains at least 5 grams of fiber per serving. °· Use beans in place of meat in soups, salads, or pasta. °· Eat high-fiber snacks, such as berries, raw vegetables, nuts, or popcorn. °  °This information is not intended to replace advice given to you by your health care provider. Make sure you discuss any questions you have with your health care provider. °  °Document Released: 04/01/2005 Document Revised: 04/22/2014 Document   Reviewed: 09/14/2013 °Elsevier Interactive Patient Education ©2016 Elsevier Inc. ° °

## 2015-06-09 NOTE — MAU Note (Signed)
PT SAYS SHE HAS BLOOD IN STOOL - NOTICED AT 2300-- BLOOD  WITH MUCUS.     HAS BEEN VERY CONSTIPATED-   HAD FLEETS ENEMA  AT 1045-   POOR  RESULTS .   HAS HX OF CHRONIC CONSTIPATION.        ON 2-17-  WAS AT [facility]    WITH [facility]-     AND  [facility]-  GAVE HER ZOFRAN-    THEN  BECAME  MORE  CONSTIPATED           ON 2-20-  AT DR   Renaldo Fiddler    OFFICE -   STILL HAVING  [facility]-  BUT [facility] - CLOSED.

## 2015-06-09 NOTE — MAU Provider Note (Signed)
History     CSN: [id number]  Arrival date and time: 06/09/15 0125   First Provider Initiated Contact with Patient 06/09/15 0210      Chief Complaint  Patient presents with  . Constipation   HPI Comments: [name] [name] is a 25 y.o. G1P0 at [redacted]w[redacted]d who presents today with constipation and blood in her stool. She denies any VB or LOF. She confirms normal fetal movement. She states that she has dealt with constipation for several years. She states that she has been doing well, but then last week she had a stomach bug. She had N/V/D. She went to the ED, and was given zofran. Now she has been unable to have a BM this week.   Constipation This is a new problem. The current episode started yesterday. The problem is unchanged. Her stool frequency is 1 time per week or less. The stool is described as firm, formed and blood tinged. The patient is not on a high fiber diet. She does not exercise regularly. There has been adequate water intake. Associated symptoms include abdominal pain. Pertinent negatives include no diarrhea, fever or nausea. She has tried enemas for the symptoms. The treatment provided mild (one small BM after enema last night. Blood in stool. ) relief.     Past Medical History  Diagnosis Date  . History of asthma     mild  . History of chicken pox   . History of hay fever   . History of kidney disease   . History of kidney stones   . History of UTI     Past Surgical History  Procedure Laterality Date  . Appendectomy  2011  . Ablation on endometriosis  2011  . Kidney surgery  1998, 2007, 2011    Blockage Removal  . Shoulder surgery  2007    Family History  Problem Relation Age of Onset  . Ovarian cancer Mother   . Uterine cancer Mother   . Stroke Father   . High blood pressure Father   . Sudden death Father   . Diabetes Maternal Grandmother     Social History  Substance Use Topics  . Smoking status: Never Smoker   . Smokeless tobacco: None  . Alcohol  Use: 0.0 oz/week    0 Standard drinks or equivalent per week     Comment: wine -occ    Allergies: No Known Allergies  Prescriptions prior to admission  Medication Sig Dispense Refill Last Dose  . amoxicillin (AMOXIL) 500 MG capsule Take 1 capsule (500 mg total) by mouth 2 (two) times daily. 10 capsule 0   . Prenatal Vit-Fe Fumarate-FA (PRENATAL MULTIVITAMIN) TABS tablet Take 1 tablet by mouth daily at 12 noon.   Taking    Review of Systems  Constitutional: Negative for fever and chills.  Gastrointestinal: Positive for abdominal pain and constipation. Negative for nausea and diarrhea.  Genitourinary: Negative for dysuria, urgency and frequency.   Physical Exam   Blood pressure 114/77, pulse 85, temperature 98.4 F (36.9 C), temperature source Oral, resp. rate 20, height  (1.626 m), weight 74.106 kg (163 lb 6 oz), last menstrual period 10/03/2014.  Physical Exam  Nursing note and vitals reviewed. Constitutional: She is oriented to person, place, and time. She appears well-developed and well-nourished. No distress.  HENT:  Head: Normocephalic.  Cardiovascular: Normal rate.   Respiratory: Effort normal.  GI: Soft. There is no tenderness. There is no rebound.  Neurological: She is alert and oriented to person, place, and  time.  Skin: Skin is warm and dry.  Psychiatric: She has a normal mood and affect.   FHT: 115, moderate with 15x15 accels, no decels Toco: no ucs  MAU Course  Procedures  MDM  0220: D/W Dr. Langston Masker, ok for dc home after enema.  4782: Patient reports excellent results with the enema   Assessment and Plan   1. Constipation, unspecified constipation type    DC home Comfort measures reviewed  3rd Trimester precautions  PTL precautions  Fetal kick counts RX: colace BID #60  Return to MAU as needed FU with OB as planned  Follow-up Information    Follow up with MORRIS, MEGAN, DO.   Specialty:  Obstetrics and Gynecology   Why:  As scheduled    Contact information:   [address], [address], [address] [place] [address] [phone]         Tawnya Crook 06/09/2015, 2:11 AM

## 2015-06-19 ENCOUNTER — Telehealth (HOSPITAL_COMMUNITY): Payer: Self-pay | Admitting: *Deleted

## 2015-06-19 ENCOUNTER — Encounter (HOSPITAL_COMMUNITY): Payer: Self-pay | Admitting: *Deleted

## 2015-06-19 NOTE — Telephone Encounter (Signed)
Preadmission screen  

## 2015-06-20 NOTE — H&P (Signed)
NAMLenn Sink:  [name], [name]               ACCOUNT NO.:  [id number]  MEDICAL RECORD NO.:  [id number]  LOCATION:                                 FACILITY:  PHYSICIAN:  Duke Salviaichard M. Marcelle OverlieHolland, M.D.DATE OF BIRTH:  [birth year]  DATE OF ADMISSION:  07/05/2015 DATE OF DISCHARGE:                             HISTORY & PHYSICAL   CHIEF COMPLAINT:  For primary C-section for breech presentation at term.  HISTORY OF PRESENT ILLNESS:  A 25 year old, G1, P0, at term, persistent breech presentation presents for primary cesarean section.  This procedure including specific risks related to bleeding, infection, transfusion, adjacent organ injury, wound infection, phlebitis, all reviewed with her which she understands and accepts.  Her 1 hour GTT was normal at 78.  She is Rh negative, did receive RhoGAM.  GBS was negative.  PAST MEDICAL HISTORY:  ALLERGIES:  None.  PRIOR SURGERIES:  She has had prior laparoscopy with excision of endometriosis.  She has also had a laparoscopic appendectomy, right shoulder arthroscopy, has had a laparotomy for ureteral reimplantation at age 89, and has also had cystoscopy with retrogrades for followup later.  For the remainder of her social and family history, please see the [name] [name] form for details.  PHYSICAL EXAMINATION:  VITAL SIGNS:  Temp 98.2, blood pressure 120/78. HEENT:  Unremarkable. NECK:  Supple without masses. LUNGS:  Clear. CARDIOVASCULAR:  Regular rate and rhythm without murmurs, rubs, or gallops. BREASTS:  Without masses. ABDOMEN:  Term fundal height, fetal heart rate 140, breech by Leopold's, cervix closed. EXTREMITIES:  Unremarkable. NEUROLOGIC:  Unremarkable.  IMPRESSION:  Breech presentation at term.  PLAN:  Primary cesarean section.  Procedure and risks discussed as above.     [name] [name] M. Marcelle OverlieHolland, M.D.     [facility]/MEDQ  D:  06/20/2015  T:  06/20/2015  Job:  [id number]

## 2015-06-20 NOTE — H&P (Signed)
[name] [name]  DICTATION # [id number] CSN# [id number]   Meriel PicaHOLLAND,Lakely Elmendorf M, MD 06/20/2015 8:52 AM

## 2015-06-26 ENCOUNTER — Telehealth (HOSPITAL_COMMUNITY): Payer: Self-pay | Admitting: *Deleted

## 2015-06-26 ENCOUNTER — Encounter (HOSPITAL_COMMUNITY): Payer: Self-pay | Admitting: *Deleted

## 2015-06-26 NOTE — Telephone Encounter (Signed)
Preadmission screen Height 64 inches wt.  173.9 Colace qd Multivitamin bid Occasional tylenol last dose 2 weeks ago

## 2015-06-30 ENCOUNTER — Encounter (HOSPITAL_COMMUNITY): Payer: Self-pay

## 2015-06-30 NOTE — Patient Instructions (Addendum)
Your procedure is scheduled on:  Tuesday, July 04, 2015  [facility] through [facility] at:  11:30 AM  Pick up the phone at the desk and dial [phone].  Call this number if you have problems the morning of surgery: [phone].  Remember:  Do NOT eat food:  After Midnight Monday  Do NOT drink clear liquids after:  9:00 AM day of surgery  Take these medicines the morning of surgery with a SIP OF WATER:  None  Do NOT wear jewelry (body piercing), metal hair clips/bobby pins, or nail polish. Do NOT wear lotions, powders, or perfumes.  You may wear deoderant. Do NOT shave for 48 hours prior to surgery. Do NOT bring valuables to the hospital.  Leave suitcase in car.  After surgery it may be brought to your room.  For patients admitted to the hospital, checkout time is 11:00 AM the day of discharge.

## 2015-07-03 ENCOUNTER — Inpatient Hospital Stay (HOSPITAL_COMMUNITY)
Admission: RE | Admit: 2015-07-03 | Discharge: 2015-07-03 | Disposition: A | Discharge status: Home / Self Care | Payer: 59 | Source: Ambulatory Visit

## 2015-07-03 ENCOUNTER — Encounter (HOSPITAL_COMMUNITY)
Admission: RE | Admit: 2015-07-03 | Discharge: 2015-07-03 | Disposition: A | Discharge status: Home / Self Care | Payer: 59 | Source: Ambulatory Visit | Attending: Obstetrics and Gynecology | Admitting: Obstetrics and Gynecology

## 2015-07-03 HISTORY — DX: Anxiety disorder, unspecified: F41.9

## 2015-07-03 LAB — CBC
HCT: 33.6 % — ABNORMAL LOW (ref 36.0–46.0)
Hemoglobin: 11.6 g/dL — ABNORMAL LOW (ref 12.0–15.0)
MCH: 34.2 pg — AB (ref 26.0–34.0)
MCHC: 34.5 g/dL (ref 30.0–36.0)
MCV: 99.1 fL (ref 78.0–100.0)
PLATELETS: 147 10*3/uL — AB (ref 150–400)
RBC: 3.39 MIL/uL — AB (ref 3.87–5.11)
RDW: 13.6 % (ref 11.5–15.5)
WBC: 9.5 10*3/uL (ref 4.0–10.5)

## 2015-07-03 MED ORDER — CEFOTETAN DISODIUM 2 G IJ SOLR
2.0000 g | INTRAMUSCULAR | Status: AC
  Administered 2015-07-04: 2 g via INTRAVENOUS
  Filled 2015-07-03: qty 2

## 2015-07-04 ENCOUNTER — Encounter (HOSPITAL_COMMUNITY)
Admission: AD | Disposition: A | Discharge status: Home / Self Care | Payer: Self-pay | Source: Ambulatory Visit | Attending: Obstetrics & Gynecology

## 2015-07-04 ENCOUNTER — Encounter (HOSPITAL_COMMUNITY): Payer: Self-pay | Admitting: *Deleted

## 2015-07-04 ENCOUNTER — Inpatient Hospital Stay (HOSPITAL_COMMUNITY): Payer: 59 | Admitting: Anesthesiology

## 2015-07-04 ENCOUNTER — Inpatient Hospital Stay (HOSPITAL_COMMUNITY): Admission: RE | Admit: 2015-07-04 | Payer: 59 | Source: Ambulatory Visit | Admitting: Obstetrics and Gynecology

## 2015-07-04 ENCOUNTER — Encounter (HOSPITAL_COMMUNITY): Admission: RE | Payer: Self-pay | Source: Ambulatory Visit

## 2015-07-04 ENCOUNTER — Inpatient Hospital Stay (HOSPITAL_COMMUNITY)
Admission: AD | Admit: 2015-07-04 | Discharge: 2015-07-08 | DRG: 766 | Disposition: A | Discharge status: Home / Self Care | Payer: 59 | Source: Ambulatory Visit | Attending: Obstetrics & Gynecology | Admitting: Obstetrics & Gynecology

## 2015-07-04 DIAGNOSIS — Z3A39 39 weeks gestation of pregnancy: Secondary | ICD-10-CM | POA: Diagnosis not present

## 2015-07-04 DIAGNOSIS — O26893 Other specified pregnancy related conditions, third trimester: Secondary | ICD-10-CM | POA: Diagnosis present

## 2015-07-04 DIAGNOSIS — O321XX0 Maternal care for breech presentation, not applicable or unspecified: Principal | ICD-10-CM | POA: Diagnosis present

## 2015-07-04 DIAGNOSIS — Z6791 Unspecified blood type, Rh negative: Secondary | ICD-10-CM

## 2015-07-04 DIAGNOSIS — Z98891 History of uterine scar from previous surgery: Secondary | ICD-10-CM

## 2015-07-04 LAB — PREPARE RBC (CROSSMATCH)

## 2015-07-04 LAB — RPR: RPR Ser Ql: NONREACTIVE

## 2015-07-04 SURGERY — Surgical Case
Anesthesia: Regional

## 2015-07-04 SURGERY — Surgical Case
Anesthesia: Spinal

## 2015-07-04 MED ORDER — KETOROLAC TROMETHAMINE 30 MG/ML IJ SOLN
30.0000 mg | Freq: Once | INTRAMUSCULAR | Status: AC
  Administered 2015-07-04: 30 mg via INTRAMUSCULAR

## 2015-07-04 MED ORDER — LACTATED RINGERS IV SOLN
INTRAVENOUS | Status: DC
  Administered 2015-07-04 (×2): via INTRAVENOUS

## 2015-07-04 MED ORDER — KETOROLAC TROMETHAMINE 30 MG/ML IJ SOLN
INTRAMUSCULAR | Status: AC
  Filled 2015-07-04: qty 1

## 2015-07-04 MED ORDER — MORPHINE SULFATE (PF) 0.5 MG/ML IJ SOLN
INTRAMUSCULAR | Status: DC | PRN
  Administered 2015-07-04: .5 ug via INTRATHECAL

## 2015-07-04 MED ORDER — PNEUMOCOCCAL VAC POLYVALENT 25 MCG/0.5ML IJ INJ
0.5000 mL | INJECTION | INTRAMUSCULAR | Status: DC
  Filled 2015-07-04: qty 0.5

## 2015-07-04 MED ORDER — MEPERIDINE HCL 25 MG/ML IJ SOLN
INTRAMUSCULAR | Status: AC
  Filled 2015-07-04: qty 1

## 2015-07-04 MED ORDER — SODIUM CHLORIDE 0.9 % IV SOLN
10000.0000 ug | INTRAVENOUS | Status: DC | PRN
  Administered 2015-07-04: 40 ug via INTRAVENOUS
  Administered 2015-07-04: 20 ug via INTRAVENOUS
  Administered 2015-07-04: 60 ug via INTRAVENOUS

## 2015-07-04 MED ORDER — MORPHINE SULFATE (PF) 0.5 MG/ML IJ SOLN
INTRAMUSCULAR | Status: AC
  Filled 2015-07-04: qty 10

## 2015-07-04 MED ORDER — MEPERIDINE HCL 25 MG/ML IJ SOLN
INTRAMUSCULAR | Status: DC | PRN
  Administered 2015-07-04: 25 mg via INTRAVENOUS

## 2015-07-04 MED ORDER — ONDANSETRON HCL 4 MG/2ML IJ SOLN
INTRAMUSCULAR | Status: AC
  Filled 2015-07-04: qty 2

## 2015-07-04 MED ORDER — PHENYLEPHRINE 40 MCG/ML (10ML) SYRINGE FOR IV PUSH (FOR BLOOD PRESSURE SUPPORT)
PREFILLED_SYRINGE | INTRAVENOUS | Status: AC
  Filled 2015-07-04: qty 10

## 2015-07-04 MED ORDER — CITRIC ACID-SODIUM CITRATE 334-500 MG/5ML PO SOLN
30.0000 mL | Freq: Once | ORAL | Status: AC
  Administered 2015-07-04: 30 mL via ORAL
  Filled 2015-07-04: qty 15

## 2015-07-04 MED ORDER — DIBUCAINE 1 % RE OINT: 1.0000 "application " | TOPICAL_OINTMENT | RECTAL | Status: DC | PRN

## 2015-07-04 MED ORDER — SCOPOLAMINE 1 MG/3DAYS TD PT72
MEDICATED_PATCH | TRANSDERMAL | Status: AC
  Filled 2015-07-04: qty 1

## 2015-07-04 MED ORDER — OXYTOCIN 10 UNIT/ML IJ SOLN
40.0000 [IU] | INTRAVENOUS | Status: DC | PRN
  Administered 2015-07-04: 40 [IU] via INTRAVENOUS

## 2015-07-04 MED ORDER — PRENATAL MULTIVITAMIN CH
1.0000 | ORAL_TABLET | Freq: Every day | ORAL | Status: DC
  Administered 2015-07-05 – 2015-07-08 (×4): 1 via ORAL
  Filled 2015-07-04 (×4): qty 1

## 2015-07-04 MED ORDER — CEFAZOLIN SODIUM-DEXTROSE 2-3 GM-% IV SOLR
INTRAVENOUS | Status: AC
  Filled 2015-07-04: qty 50

## 2015-07-04 MED ORDER — FENTANYL CITRATE (PF) 100 MCG/2ML IJ SOLN
INTRAMUSCULAR | Status: AC
  Filled 2015-07-04: qty 2

## 2015-07-04 MED ORDER — IBUPROFEN 600 MG PO TABS
600.0000 mg | ORAL_TABLET | Freq: Four times a day (QID) | ORAL | Status: DC
  Administered 2015-07-04 – 2015-07-08 (×16): 600 mg via ORAL
  Filled 2015-07-04 (×16): qty 1

## 2015-07-04 MED ORDER — DIPHENHYDRAMINE HCL 25 MG PO CAPS
25.0000 mg | ORAL_CAPSULE | Freq: Four times a day (QID) | ORAL | Status: DC | PRN
  Administered 2015-07-05: 25 mg via ORAL
  Filled 2015-07-04: qty 1

## 2015-07-04 MED ORDER — LACTATED RINGERS IV SOLN
INTRAVENOUS | Status: DC
  Administered 2015-07-04: 22:00:00 via INTRAVENOUS

## 2015-07-04 MED ORDER — FENTANYL CITRATE (PF) 100 MCG/2ML IJ SOLN
INTRAMUSCULAR | Status: DC | PRN
  Administered 2015-07-04: 25 ug via INTRATHECAL

## 2015-07-04 MED ORDER — ONDANSETRON HCL 4 MG PO TABS: 4.0000 mg | ORAL_TABLET | ORAL | Status: DC | PRN

## 2015-07-04 MED ORDER — PHENYLEPHRINE 8 MG IN D5W 100 ML (0.08MG/ML) PREMIX OPTIME
INJECTION | INTRAVENOUS | Status: AC
  Filled 2015-07-04: qty 100

## 2015-07-04 MED ORDER — SCOPOLAMINE 1 MG/3DAYS TD PT72
1.0000 | MEDICATED_PATCH | Freq: Once | TRANSDERMAL | Status: DC
  Administered 2015-07-04: 1.5 mg via TRANSDERMAL
  Filled 2015-07-04: qty 1

## 2015-07-04 MED ORDER — SENNOSIDES-DOCUSATE SODIUM 8.6-50 MG PO TABS
2.0000 | ORAL_TABLET | ORAL | Status: DC
  Administered 2015-07-04 – 2015-07-07 (×4): 2 via ORAL
  Filled 2015-07-04 (×4): qty 2

## 2015-07-04 MED ORDER — LACTATED RINGERS IV SOLN
INTRAVENOUS | Status: DC | PRN
  Administered 2015-07-04: 10:00:00 via INTRAVENOUS

## 2015-07-04 MED ORDER — LANOLIN HYDROUS EX OINT: TOPICAL_OINTMENT | CUTANEOUS | Status: DC | PRN

## 2015-07-04 MED ORDER — ZOLPIDEM TARTRATE 5 MG PO TABS: 5.0000 mg | ORAL_TABLET | Freq: Every evening | ORAL | Status: DC | PRN

## 2015-07-04 MED ORDER — SIMETHICONE 80 MG PO CHEW
80.0000 mg | CHEWABLE_TABLET | ORAL | Status: DC | PRN
  Administered 2015-07-04 – 2015-07-07 (×5): 80 mg via ORAL
  Filled 2015-07-04 (×6): qty 1

## 2015-07-04 MED ORDER — PHENYLEPHRINE HCL 10 MG/ML IJ SOLN
INTRAMUSCULAR | Status: DC | PRN
  Administered 2015-07-04: 80 ug via INTRAVENOUS

## 2015-07-04 MED ORDER — WITCH HAZEL-GLYCERIN EX PADS: 1.0000 "application " | MEDICATED_PAD | CUTANEOUS | Status: DC | PRN

## 2015-07-04 MED ORDER — LACTATED RINGERS IV SOLN
Freq: Once | INTRAVENOUS | Status: AC
  Administered 2015-07-04: 09:00:00 via INTRAVENOUS

## 2015-07-04 MED ORDER — DEXAMETHASONE SODIUM PHOSPHATE 10 MG/ML IJ SOLN
INTRAMUSCULAR | Status: AC
  Filled 2015-07-04: qty 1

## 2015-07-04 MED ORDER — OXYTOCIN 10 UNIT/ML IJ SOLN
INTRAMUSCULAR | Status: AC
  Filled 2015-07-04: qty 4

## 2015-07-04 MED ORDER — ACETAMINOPHEN 325 MG PO TABS
650.0000 mg | ORAL_TABLET | ORAL | Status: DC | PRN
  Administered 2015-07-07: 650 mg via ORAL
  Filled 2015-07-04: qty 2

## 2015-07-04 MED ORDER — BENZOCAINE-MENTHOL 20-0.5 % EX AERO: 1.0000 "application " | INHALATION_SPRAY | CUTANEOUS | Status: DC | PRN

## 2015-07-04 MED ORDER — TETANUS-DIPHTH-ACELL PERTUSSIS 5-2.5-18.5 LF-MCG/0.5 IM SUSP: 0.5000 mL | Freq: Once | INTRAMUSCULAR | Status: DC

## 2015-07-04 MED ORDER — ONDANSETRON HCL 4 MG/2ML IJ SOLN
4.0000 mg | INTRAMUSCULAR | Status: DC | PRN
Start: 2015-07-04 — End: 2015-07-08

## 2015-07-04 SURGICAL SUPPLY — 31 items
BENZOIN TINCTURE PRP APPL 2/3 (GAUZE/BANDAGES/DRESSINGS) ×2 IMPLANT
CLAMP CORD UMBIL (MISCELLANEOUS) IMPLANT
CLOSURE STERI-STRIP 1/4X4 (GAUZE/BANDAGES/DRESSINGS) ×2 IMPLANT
CLOTH BEACON ORANGE TIMEOUT ST (SAFETY) ×2 IMPLANT
DRSG OPSITE POSTOP 4X10 (GAUZE/BANDAGES/DRESSINGS) ×2 IMPLANT
DURAPREP 26ML APPLICATOR (WOUND CARE) ×2 IMPLANT
ELECT REM PT RETURN 9FT ADLT (ELECTROSURGICAL) ×2
ELECTRODE REM PT RTRN 9FT ADLT (ELECTROSURGICAL) ×1 IMPLANT
EXTRACTOR VACUUM KIWI (MISCELLANEOUS) IMPLANT
EXTRACTOR VACUUM M CUP 4 TUBE (SUCTIONS) IMPLANT
GLOVE BIO SURGEON STRL SZ 6 (GLOVE) ×2 IMPLANT
GLOVE BIOGEL PI IND STRL 6 (GLOVE) ×2 IMPLANT
GLOVE BIOGEL PI IND STRL 7.0 (GLOVE) ×1 IMPLANT
GLOVE BIOGEL PI INDICATOR 6 (GLOVE) ×2
GLOVE BIOGEL PI INDICATOR 7.0 (GLOVE) ×1
GOWN STRL REUS W/TWL LRG LVL3 (GOWN DISPOSABLE) ×4 IMPLANT
KIT ABG SYR 3ML LUER SLIP (SYRINGE) ×2 IMPLANT
LIQUID BAND (GAUZE/BANDAGES/DRESSINGS) IMPLANT
NEEDLE HYPO 25X5/8 SAFETYGLIDE (NEEDLE) ×2 IMPLANT
NS IRRIG 1000ML POUR BTL (IV SOLUTION) ×2 IMPLANT
PACK C SECTION WH (CUSTOM PROCEDURE TRAY) ×2 IMPLANT
PAD OB MATERNITY 4.3X12.25 (PERSONAL CARE ITEMS) ×2 IMPLANT
PENCIL SMOKE EVAC W/HOLSTER (ELECTROSURGICAL) ×2 IMPLANT
SUT CHROMIC 0 CTX 36 (SUTURE) ×6 IMPLANT
SUT MON AB 2-0 CT1 27 (SUTURE) ×2 IMPLANT
SUT PDS AB 0 CT1 27 (SUTURE) IMPLANT
SUT PLAIN 0 NONE (SUTURE) IMPLANT
SUT VIC AB 0 CT1 36 (SUTURE) IMPLANT
SUT VIC AB 4-0 KS 27 (SUTURE) IMPLANT
TOWEL OR 17X24 6PK STRL BLUE (TOWEL DISPOSABLE) ×2 IMPLANT
TRAY FOLEY CATH SILVER 14FR (SET/KITS/TRAYS/PACK) IMPLANT

## 2015-07-04 NOTE — Anesthesia Postprocedure Evaluation (Signed)
Anesthesia Post Note  Patient: [name] [name]  Procedure(s) Performed: Procedure(s) (LRB): CESAREAN SECTION (N/A)  Patient location during evaluation: Mother Baby Anesthesia Type: Spinal Level of consciousness: awake and alert and oriented Pain management: satisfactory to patient Vital Signs Assessment: post-procedure vital signs reviewed and stable Respiratory status: respiratory function stable and spontaneous breathing Cardiovascular status: blood pressure returned to baseline Postop Assessment: no headache, no backache, spinal receding, patient able to bend at knees and adequate PO intake Anesthetic complications: no    Last Vitals:  Filed Vitals:   07/04/15 1435 07/04/15 1532  BP: 102/69 107/59  Pulse: 63 63  Temp:    Resp: 20 18    Last Pain:  Filed Vitals:   07/04/15 1858  PainSc: 2                  Dishawn Bhargava

## 2015-07-04 NOTE — Transfer of Care (Signed)
Immediate Anesthesia Transfer of Care Note  Patient: [name] [name]  Procedure(s) Performed: Procedure(s): CESAREAN SECTION (N/A)  Patient Location: PACU  Anesthesia Type:Spinal  Level of Consciousness: awake, alert  and oriented  Airway & Oxygen Therapy: Patient Spontanous Breathing  Post-op Assessment: Report given to RN and Post -op Vital signs reviewed and stable  Post vital signs: Reviewed and stable  Last Vitals:  Filed Vitals:   07/04/15 0809  BP: 111/72  Pulse: 83  Temp: 36.6 C  Resp: 18    Complications: No apparent anesthesia complications

## 2015-07-04 NOTE — Progress Notes (Addendum)
Please see previous H&P dictated by Dr. Marcelle OverlieHolland.  Planned C/S for breech scheduled today.  Came in laboring.  Bedside u/s confirms breech presentation. CVX 5-6cm.  Last po midnight.  Will proceed with C/S for breech with labor.   Patient counseled for risk of bleeding, infection, scarring and damage to surrounding structures.  All questions were answered and the patient wishes to proceed.  Mitchel HonourMegan Enslee Bibbins, DO

## 2015-07-04 NOTE — MAU Note (Signed)
Pt scheduled for C/S @ 1300 today.  Pt C/O uc's that started last night @ 2000, irregular.  Became regular around 0300, now 5 minutes apart.  Having bloody show, denies LOF.

## 2015-07-04 NOTE — Addendum Note (Signed)
Addendum  created 07/04/15 1939 by Graciela HusbandsWynn O Salvatore Poe, CRNA   Modules edited: Notes Section   Notes Section:  File: [id number]

## 2015-07-04 NOTE — Lactation Note (Signed)
This note was copied from a baby's chart. Lactation Consultation Note Initial visit at 10 hour of age.  Mom reports a feeding a few hours ago, but not sure baby was on good enough.  Baby just had bath and is STS showing feeding cues.  LC assisted with cross cradle hold.  LC assisted with hand expression of large drop of colostrum.  Mom is recovering from C/s so position was modified.  Mom is not able to independently latch and needed much assist.  Baby latched well with wide open mouth, flanged lips and strong rhythmic sucking for several minutes and then needed stimulation to maintain feeding.  FOB at bedside and mom is very sleepy. [facility] resources given and discussed.  Encouraged to feed with early cues on demand.  Early newborn behavior discussed.  Discussed spoon feeding as needed and exclusivity of breastfeeding.  Mom to call for assist as needed.     Patient Name: [name] Lenda KelpLauren Hayes-Palmer ZOXWR'UToday's Date: 07/04/2015 Reason for consult: Initial assessment   Maternal Data Has patient been taught Hand Expression?: Yes Does the patient have breastfeeding experience prior to this delivery?: No  Feeding Feeding Type: Breast Fed  LATCH Score/Interventions Latch: Grasps breast easily, tongue down, lips flanged, rhythmical sucking.  Audible Swallowing: A few with stimulation Intervention(s): Skin to skin;Hand expression;Alternate breast massage  Type of Nipple: Everted at rest and after stimulation  Comfort (Breast/Nipple): Soft / non-tender     Hold (Positioning): Assistance needed to correctly position infant at breast and maintain latch. Intervention(s): Breastfeeding basics reviewed;Support Pillows;Position options;Skin to skin  LATCH Score: 8  Lactation Tools Discussed/Used WIC Program: No   Consult Status Consult Status: Follow-up Date: 07/05/15 Follow-up type: In-patient    Shoptaw, Arvella MerlesJana Lynn 07/04/2015, 8:19 PM

## 2015-07-04 NOTE — Anesthesia Postprocedure Evaluation (Signed)
Anesthesia Post Note  Patient: [name] [name]  Procedure(s) Performed: Procedure(s) (LRB): CESAREAN SECTION (N/A)  Patient location during evaluation: PACU Anesthesia Type: Spinal Level of consciousness: awake Pain management: satisfactory to patient Vital Signs Assessment: post-procedure vital signs reviewed and stable Respiratory status: spontaneous breathing Cardiovascular status: blood pressure returned to baseline Postop Assessment: no headache and spinal receding Anesthetic complications: no    Last Vitals:  Filed Vitals:   07/04/15 1209 07/04/15 1210  BP:  108/79  Pulse:    Temp:    Resp: 28 14    Last Pain:  Filed Vitals:   07/04/15 1216  PainSc: 0-No pain                 Kingston Shawgo EDWARD

## 2015-07-04 NOTE — Op Note (Signed)
[name] [name] PROCEDURE DATE: 07/04/2015  PREOPERATIVE DIAGNOSIS: Intrauterine pregnancy at  882w1d weeks gestation, breech, labor  POSTOPERATIVE DIAGNOSIS: The same  PROCEDURE:  Primary Low Transverse Cesarean Section  SURGEON:  Dr. Mitchel HonourMegan Adrin Julian  INDICATIONS: [name] [name] is a 25 y.o. G1P0 at 7282w1d scheduled for cesarean section secondary to breech presentation with labor.  The risks of cesarean section discussed with the patient included but were not limited to: bleeding which may require transfusion or reoperation; infection which may require antibiotics; injury to bowel, bladder, ureters or other surrounding organs; injury to the fetus; need for additional procedures including hysterectomy in the event of a life-threatening hemorrhage; placental abnormalities wth subsequent pregnancies, incisional problems, thromboembolic phenomenon and other postoperative/anesthesia complications. The patient concurred with the proposed plan, giving informed written consent for the procedure.    FINDINGS:  Viable female infant in breech presentation, APGARs 8,9:  Weight pending  clear amniotic fluid.  Intact placenta, three vessel cord.  Grossly normal uterus, ovaries and fallopian tubes.  Thickened uterosacral ligaments bilaterally; right greater than left. .   ANESTHESIA:    Spinal ESTIMATED BLOOD LOSS: 700 ml SPECIMENS: Placenta sent to L&D COMPLICATIONS: None immediate  PROCEDURE IN DETAIL:  The patient received intravenous antibiotics and had sequential compression devices applied to her lower extremities while in the preoperative area.  She was then taken to the operating room where spinal anesthesia was administered and was found to be adequate. She was then placed in a dorsal supine position with a leftward tilt, and prepped and draped in a sterile manner.  A foley catheter was placed into her bladder and attached to constant gravity.  After an adequate timeout was performed, a  Pfannenstiel skin incision was made with scalpel and carried through to the underlying layer of fascia. The fascia was incised in the midline and this incision was extended bilaterally using the Mayo scissors. Kocher clamps were applied to the superior aspect of the fascial incision and the underlying rectus muscles were dissected off bluntly. A similar process was carried out on the inferior aspect of the facial incision. The rectus muscles were separated in the midline bluntly and the peritoneum was entered bluntly.   Bladder flap was created sharply and developed bluntly.  Bladder blade was placed to protect bladder .A transverse hysterotomy was made with a scalpel and extended bilaterally bluntly. The bladder blade was then removed. The infant was successfully delivered using typical breech maneuvers, and cord was clamped and cut and infant was handed over to awaiting neonatology team. Uterine massage was then administered and the placenta delivered intact with three-vessel cord. The uterus was cleared of clot and debris.  The hysterotomy was closed with 0 Chromic.  A second imbricating suture of 0-Chromic was used to reinforce the incision and aid in hemostasis.  Survey of the abdomen revealed the above listed findings. The peritoneum and rectus muscles were noted to be hemostatic and were reapproximated using 3-0 monocryl.  The fascia was closed with 0-Vicryl in a running fashion with good restoration of anatomy.  The subcutaneus tissue was copiously irrigated.  The skin was closed with 4-0 Vicryl in a subcuticular fashion.  Pt tolerated the procedure will.  All counts were correct x2.  Pt went to the recovery room in stable condition.

## 2015-07-04 NOTE — Anesthesia Preprocedure Evaluation (Signed)
Anesthesia Evaluation  Patient identified by MRN, date of birth, ID band Patient awake    Reviewed: Allergy & Precautions, H&P , Patient's Chart, lab work & pertinent test results  Airway Mallampati: II  TM Distance: >3 FB Neck ROM: full    Dental no notable dental hx.    Pulmonary    Pulmonary exam normal breath sounds clear to auscultation       Cardiovascular Exercise Tolerance: Good  Rhythm:regular Rate:Normal     Neuro/Psych    GI/Hepatic   Endo/Other    Renal/GU      Musculoskeletal   Abdominal   Peds  Hematology  (+) anemia ,   Anesthesia Other Findings   Reproductive/Obstetrics                             Anesthesia Physical Anesthesia Plan  ASA: II and emergent  Anesthesia Plan: Spinal   Post-op Pain Management:    Induction:   Airway Management Planned:   Additional Equipment:   Intra-op Plan:   Post-operative Plan:   Informed Consent: I have reviewed the patients History and Physical, chart, labs and discussed the procedure including the risks, benefits and alternatives for the proposed anesthesia with the patient or authorized representative who has indicated his/her understanding and acceptance.   Dental Advisory Given  Plan Discussed with: CRNA  Anesthesia Plan Comments: (Lab work confirmed with CRNA in room. Platelets okay. Discussed spinal anesthetic, and patient consents to the procedure:  included risk of possible headache,backache, failed block, allergic reaction, and nerve injury. This patient was asked if she had any questions or concerns before the procedure started. )        Anesthesia Quick Evaluation

## 2015-07-04 NOTE — Anesthesia Procedure Notes (Signed)
Spinal Patient location during procedure: OR Preanesthetic Checklist Completed: patient identified, site marked, surgical consent, pre-op evaluation, timeout performed, IV checked, risks and benefits discussed and monitors and equipment checked Spinal Block Patient position: sitting Prep: DuraPrep Patient monitoring: heart rate, cardiac monitor, continuous pulse ox and blood pressure Approach: midline Location: L3-4 Injection technique: single-shot Needle Needle type: Sprotte  Needle gauge: 24 G Needle length: 9 cm Assessment Sensory level: T3 Additional Notes Spinal Dosage in OR  Bupivicaine ml       1.5 PFMS04   mcg        100 Fentanyl mcg            25

## 2015-07-05 ENCOUNTER — Encounter (HOSPITAL_COMMUNITY): Payer: Self-pay | Admitting: Obstetrics & Gynecology

## 2015-07-05 LAB — CBC
HCT: 29.4 % — ABNORMAL LOW (ref 36.0–46.0)
Hemoglobin: 10.2 g/dL — ABNORMAL LOW (ref 12.0–15.0)
MCH: 34.5 pg — ABNORMAL HIGH (ref 26.0–34.0)
MCHC: 34.7 g/dL (ref 30.0–36.0)
MCV: 99.3 fL (ref 78.0–100.0)
PLATELETS: 125 10*3/uL — AB (ref 150–400)
RBC: 2.96 MIL/uL — ABNORMAL LOW (ref 3.87–5.11)
RDW: 13.5 % (ref 11.5–15.5)
WBC: 12.2 10*3/uL — AB (ref 4.0–10.5)

## 2015-07-05 LAB — BIRTH TISSUE RECOVERY COLLECTION (PLACENTA DONATION)

## 2015-07-05 MED ORDER — RHO D IMMUNE GLOBULIN 1500 UNIT/2ML IJ SOSY
300.0000 ug | PREFILLED_SYRINGE | Freq: Once | INTRAMUSCULAR | Status: AC
  Administered 2015-07-05: 300 ug via INTRAVENOUS
  Filled 2015-07-05: qty 2

## 2015-07-05 MED ORDER — OXYCODONE-ACETAMINOPHEN 5-325 MG PO TABS
1.0000 | ORAL_TABLET | ORAL | Status: DC | PRN
  Administered 2015-07-05: 2 via ORAL
  Administered 2015-07-05 – 2015-07-07 (×7): 1 via ORAL
  Filled 2015-07-05 (×4): qty 1
  Filled 2015-07-05: qty 2
  Filled 2015-07-05 (×3): qty 1

## 2015-07-05 NOTE — Lactation Note (Signed)
This note was copied from a baby's chart. Lactation Consultation Note  Patient Name: [name] CruiseKennedy [name] ZOXWR'UToday's Date: 07/05/2015 Reason for consult: Follow-up assessment Baby at 29 hr of life and mom is worried that baby is not eating enough. Baby has a wide gap, extends tongue well over gum ridge, has moderate laterization of tongue, can lift tongue past midline, and has a thin labial frenulum with an insertion point above mid gum. Baby had good peristalic motion when sucking on a gloved finger. When baby goes to breast she is not latching to the bottom half of mom's nipple. Mom does have soft breast with a wide in diameter nipple that becomes very erect with stimulation. After changing baby's position and having mom "sandwich" the nipple baby was more successful, but mom does not support the breast at every attempt to latch. She is trying to push the nipple in the baby's mouth. Discussed a NS and post pumping but mom is going to keep working with baby for now. Discussed the risk of a pacifier. She will call for help as needed. She is aware of OP services and support group.     Maternal Data    Feeding Feeding Type: Breast Fed Length of feed: 15 min  LATCH Score/Interventions Latch: Repeated attempts needed to sustain latch, nipple held in mouth throughout feeding, stimulation needed to elicit sucking reflex. Intervention(s): Skin to skin;Teach feeding cues Intervention(s): Adjust position;Breast compression;Assist with latch  Audible Swallowing: A few with stimulation Intervention(s): Hand expression Intervention(s): Alternate breast massage  Type of Nipple: Everted at rest and after stimulation  Comfort (Breast/Nipple): Soft / non-tender     Hold (Positioning): Full assist, staff holds infant at breast Intervention(s): Support Pillows;Position options  LATCH Score: 6  Lactation Tools Discussed/Used     Consult Status Consult Status: Follow-up Date: 07/06/15 Follow-up  type: In-patient    [name] 07/05/2015, 3:51 PM

## 2015-07-05 NOTE — Progress Notes (Signed)
MOB was referred for history of depression/anxiety.  Referral is screened out by Clinical Social Worker because none of the following criteria appear to apply: -History of anxiety/depression during this pregnancy, or of post-partum depression. - Diagnosis of anxiety and/or depression within last 3 years or -MOB's symptoms are currently being treated with medication and/or therapy.  Please contact the Clinical Social Worker if needs arise or upon MOB request.  

## 2015-07-05 NOTE — Progress Notes (Signed)
Subjective: Postpartum Day 1: Cesarean Delivery Patient reports tolerating PO, + flatus and no problems voiding.    Objective: Vital signs in last 24 hours: Temp:  [97.5 F (36.4 C)-98.5 F (36.9 C)] 98.3 F (36.8 C) (03/22 0520) Pulse Rate:  [60-108] 60 (03/22 0520) Resp:  [11-34] 18 (03/22 0520) BP: (94-116)/(50-79) 106/51 mmHg (03/22 0520) SpO2:  [95 %-100 %] 97 % (03/22 0520)  Physical Exam:  General: alert and cooperative Lochia: appropriate Uterine Fundus: firm Incision: healing well DVT Evaluation: No evidence of DVT seen on physical exam. Negative Homan's sign. No cords or calf tenderness. No significant calf/ankle edema.   Recent Labs  07/03/15 1205 07/05/15 0530  HGB 11.6* 10.2*  HCT 33.6* 29.4*    Assessment/Plan: Status post Cesarean section. Doing well postoperatively.  Continue current care.  [name] [name] G 07/05/2015, 8:27 AM

## 2015-07-06 LAB — RH IG WORKUP (INCLUDES ABO/RH)
ABO/RH(D): A NEG
Fetal Screen: NEGATIVE
GESTATIONAL AGE(WKS): 39
UNIT DIVISION: 0

## 2015-07-06 NOTE — Progress Notes (Signed)
LC aware of problems with sustaining a latch. Requested LC to see pt.

## 2015-07-06 NOTE — Lactation Note (Signed)
This note was copied from a baby's chart. Lactation Consultation Note  Patient Name: [name] [name] WUJWJ'XToday's Date: 07/06/2015 Reason for consult: Follow-up assessment Baby at 48 hr of life and mom reports baby has had longer bursts of sucking last night and early this am. She is not using the NS and has only DEBP 1 time. She is hand expressing to get baby interested in the breast. Encouraged mom to call for latch help at next feeding. Mom is reporting bilateral sore nipples, given comfort gels. Mom should be offering the breast 8+/24hr, f/u with expressed milk per supplementing guidelines if baby is struggling with latch. Discussed breast changes and nipple care. She is aware of OP services and support group.    Maternal Data    Feeding Feeding Type: Breast Fed Length of feed: 15 min  LATCH Score/Interventions                      Lactation Tools Discussed/Used     Consult Status Consult Status: Follow-up Date: 07/07/15 Follow-up type: In-patient    Rulon Eisenmengerlizabeth E Kinlee Garrison 07/06/2015, 10:25 AM

## 2015-07-06 NOTE — Progress Notes (Signed)
Subjective: Postpartum Day 2: Cesarean Delivery Patient reports tolerating PO, + flatus and no problems voiding.    Objective: Vital signs in last 24 hours: Temp:  [97.8 F (36.6 C)-98.3 F (36.8 C)] 98.2 F (36.8 C) (03/23 0530) Pulse Rate:  [60-72] 72 (03/23 0530) Resp:  [18-20] 20 (03/23 0530) BP: (106-108)/(63-64) 107/64 mmHg (03/23 0530) SpO2:  [98 %] 98 % (03/22 0852)  Physical Exam:  General: alert and cooperative Lochia: appropriate Uterine Fundus: firm Incision: healing well DVT Evaluation: No evidence of DVT seen on physical exam. Negative Homan's sign. No cords or calf tenderness. No significant calf/ankle edema.   Recent Labs  07/03/15 1205 07/05/15 0530  HGB 11.6* 10.2*  HCT 33.6* 29.4*    Assessment/Plan: Status post Cesarean section. Doing well postoperatively.  Continue current care.  Leonard Feigel G 07/06/2015, 8:49 AM

## 2015-07-07 LAB — TYPE AND SCREEN
ABO/RH(D): A NEG
Antibody Screen: POSITIVE
DAT, IgG: NEGATIVE
UNIT DIVISION: 0
UNIT DIVISION: 0

## 2015-07-07 NOTE — Lactation Note (Signed)
This note was copied from a baby's chart. Lactation Consultation Note Assisted mom w/SNS to stimulate baby to suckle on breast. Baby has no interest in BF. Will suck on finger but will push nipple out of mouth. Mom pumped w/DEBP and pumped 20ml and hand expressed 10ml breast are filling and relieved by pumping. Baby latched to Rt. Breast and noted softening of breast after feeding. Had to constantly stimulate baby to suck, even w/wet cloth. Had to use curve tip syring in baby's mouth while on breast to stimulate to suck on breast w/NS. Fitted mom w/#20 nipple shield.  Parents are very worried because the baby will not BF on her own and has poor output and intake. Mom crying, FOB about to cry. LC discussed newborn habits at times, and things to try to stimulate to BF. Needed constant hands on work with baby to BF.   Patient Name: [name] [name] RUEAV'WToday's Date: 07/07/2015 Reason for consult: Follow-up assessment;Difficult latch   Maternal Data    Feeding Feeding Type: Breast Milk Length of feed: 20 min (on breast 35 min, est. BF 20 min.)  LATCH Score/Interventions Latch: Repeated attempts needed to sustain latch, nipple held in mouth throughout feeding, stimulation needed to elicit sucking reflex. Intervention(s): Skin to skin;Teach feeding cues;Waking techniques Intervention(s): Adjust position;Assist with latch;Breast massage;Breast compression  Audible Swallowing: Spontaneous and intermittent Intervention(s): Skin to skin;Hand expression Intervention(s): Alternate breast massage  Type of Nipple: Everted at rest and after stimulation Intervention(s): Double electric pump  Comfort (Breast/Nipple): Filling, red/small blisters or bruises, mild/mod discomfort  Problem noted: Filling Interventions (Filling): Double electric pump;Massage Interventions (Mild/moderate discomfort): Hand massage;Hand expression;Post-pump;Comfort gels;Breast shields  Hold (Positioning): Assistance needed  to correctly position infant at breast and maintain latch. Intervention(s): Position options;Support Pillows;Skin to skin;Breastfeeding basics reviewed  LATCH Score: 7  Lactation Tools Discussed/Used Tools: Nipple Shields;Pump;Comfort gels;Supplemental Nutrition System Nipple shield size: 20 Breast pump type: Double-Electric Breast Pump   Consult Status Consult Status: Follow-up Date: 07/07/15 Follow-up type: In-patient    Nandan Willems, Diamond NickelLAURA G 07/07/2015, 5:59 AM

## 2015-07-07 NOTE — Progress Notes (Signed)
Subjective: Postpartum Day 2: Cesarean Delivery Patient reports tolerating PO, + flatus and no problems voiding.   Baby not feeding well Objective: Vital signs in last 24 hours: Temp:  [97.8 F (36.6 C)-98.4 F (36.9 C)] 97.8 F (36.6 C) (03/24 0611) Pulse Rate:  [73-74] 73 (03/24 0611) Resp:  [18-20] 18 (03/24 0611) BP: (102-114)/(64-67) 102/64 mmHg (03/24 0611) SpO2:  [99 %] 99 % (03/23 1900)  Physical Exam:  General: alert and cooperative Lochia: appropriate Uterine Fundus: firm Incision: healing well DVT Evaluation: No evidence of DVT seen on physical exam. Negative Homan's sign. No cords or calf tenderness.   Recent Labs  07/05/15 0530  HGB 10.2*  HCT 29.4*    Assessment/Plan: Status post Cesarean section. Doing well postoperatively.  Continue current care.  Bessie Boyte G 07/07/2015, 7:26 AM

## 2015-07-07 NOTE — Lactation Note (Signed)
This note was copied from a baby's chart. Lactation Consultation Note New parents, mom upset and tearful at times worried about baby not BF, hasn't peed or pooped in 24 hrs, and poor BF. Mo  Requested formula in bottle so feed baby./ baby took 16 ml formula. Baby would suckle on glove finger and curve tip syring, but wouldn't open mouth to latch on breast. Clamps hard of finger. Has recessed chin and needs chin tug. Can move tongue past gum line. Has upper labial frenulum. Jaundice. Mom is using DEBP and pumped 25 ml colostrum, hand expressed 10ml colostrum. Moms breast is filling, encouraged to BF, use DEBP after BF, hand express. Baby isn't latching is the main problem and decreased output as well as poor BF. Worked w/mom and baby, but baby wouldn't latch, would suck on gloved finger, gave 3 ml colostrum to stimulate to BF but wouldn't open for BF. Suggested not to supplement w/formula d/t a lot of colostrum. [facility].  Patient Name: [name] [name] BMWUX'LToday's Date: 07/07/2015 Reason for consult: Follow-up assessment;Difficult latch;Infant weight loss   Maternal Data    Feeding Feeding Type: Breast Milk Length of feed: 0 min  LATCH Score/Interventions Latch: Too sleepy or reluctant, no latch achieved, no sucking elicited. Intervention(s): Skin to skin;Teach feeding cues;Waking techniques Intervention(s): Adjust position;Assist with latch;Breast massage;Breast compression  Audible Swallowing: None Intervention(s): Skin to skin;Hand expression Intervention(s): Alternate breast massage  Type of Nipple: Everted at rest and after stimulation Intervention(s): Double electric pump  Comfort (Breast/Nipple): Filling, red/small blisters or bruises, mild/mod discomfort  Problem noted: Filling Interventions (Filling): Massage;Firm support;Frequent nursing;Double electric pump Interventions (Mild/moderate discomfort): Hand expression;Post-pump;Comfort gels  Hold  (Positioning): Full assist, staff holds infant at breast Intervention(s): Breastfeeding basics reviewed;Support Pillows;Position options;Skin to skin  LATCH Score: 3  Lactation Tools Discussed/Used Tools: Pump;Comfort gels Breast pump type: Double-Electric Breast Pump   Consult Status Consult Status: Follow-up Date: 07/07/15 Follow-up type: In-patient    Charyl DancerCARVER, Sharnika Binney G 07/07/2015, 2:57 AM

## 2015-07-08 MED ORDER — OXYCODONE-ACETAMINOPHEN 5-325 MG PO TABS: 1.0000 | ORAL_TABLET | Freq: Four times a day (QID) | ORAL | Status: DC | PRN

## 2015-07-08 MED ORDER — IBUPROFEN 600 MG PO TABS: 600.0000 mg | ORAL_TABLET | Freq: Four times a day (QID) | ORAL | Status: DC | PRN

## 2015-07-08 NOTE — Lactation Note (Signed)
This note was copied from a baby's chart. Lactation Consultation Note  Parents are happy that despite challenges initially with breastfeeding, they are now able to breastfeed much better. Mother is latching baby without NS.  Baby does better on right breast than left.  Suggest she prepump on L side before latching. Mother did state she has headaches when she pumps or latches.  Encouraged mother to rest, hydrate and speak to MD. Answered questions.  Praised parents for their efforts. Continue to monitor weight.  Reviewed engorgement care and monitoring voids/stools. Mother will continue to post pump and give baby back volume pumped. Parents state at this time they feel more confident and do not need OP appointment.   Mother has comfort gels and hand pump. Mom encouraged to feed baby 8-12 times/24 hours and with feeding cues.       Patient Name: [name] CruiseKennedy [name] WUJWJ'XToday's Date: 07/08/2015     Maternal Data    Feeding Feeding Type: Breast Fed Length of feed: 35 min  LATCH Score/Interventions                      Lactation Tools Discussed/Used     Consult Status      [name] [name], [name] [name] 07/08/2015, 9:02 AM

## 2015-07-08 NOTE — Discharge Summary (Signed)
Obstetric Discharge Summary Reason for Admission: cesarean section Prenatal Procedures: none Intrapartum Procedures: cesarean: low cervical, transverse Postpartum Procedures: none Complications-Operative and Postpartum: none HEMOGLOBIN  Date Value Ref Range Status  07/05/2015 10.2* 12.0 - 15.0 g/dL Final   HGB  Date Value Ref Range Status  08/04/2013 14.5 12.0-16.0 g/dL Final   HCT  Date Value Ref Range Status  07/05/2015 29.4* 36.0 - 46.0 % Final  08/04/2013 42.7 35.0-47.0 % Final    Physical Exam:  General: alert, cooperative and no distress Lochia: appropriate Uterine Fundus: firm Incision: healing well DVT Evaluation: No evidence of DVT seen on physical exam.  Discharge Diagnoses: Term Pregnancy-delivered  Discharge Information: Date: 07/08/2015 Activity: pelvic rest Diet: routine Medications: PNV, Ibuprofen and Percocet Condition: stable Instructions: refer to practice specific booklet Discharge to: home   Newborn Data: Live born female  Birth Weight: 7 lb 0.5 oz (3190 g) APGAR: 8, 9  Home with mother.  Kaulana Brindle II,Jaskiran Pata E 07/08/2015, 8:10 AM

## 2015-07-08 NOTE — Progress Notes (Signed)
Subjective: Postpartum Day 3: Cesarean Delivery Patient reports tolerating PO, + flatus and no problems voiding.    Objective: Vital signs in last 24 hours: Temp:  [98.2 F (36.8 C)] 98.2 F (36.8 C) (03/25 0700) Pulse Rate:  [75] 75 (03/25 0700) Resp:  [18] 18 (03/25 0700) BP: (116)/(67) 116/67 mmHg (03/25 0700)  Physical Exam:  General: alert, cooperative and no distress Lochia: appropriate Uterine Fundus: firm Incision: healing well DVT Evaluation: No evidence of DVT seen on physical exam.  No results for input(s): HGB, HCT in the last 72 hours.  Assessment/Plan: Status post Cesarean section. Doing well postoperatively.  Discharge home with standard precautions and return to clinic in 4-6 weeks.  Zandria Woldt II,Whitleigh Garramone E 07/08/2015, 7:56 AM

## 2015-07-10 ENCOUNTER — Other Ambulatory Visit (INDEPENDENT_AMBULATORY_CARE_PROVIDER_SITE_OTHER): Payer: 59

## 2015-07-10 ENCOUNTER — Telehealth: Payer: Self-pay | Admitting: Family Medicine

## 2015-07-10 DIAGNOSIS — R8290 Unspecified abnormal findings in urine: Secondary | ICD-10-CM

## 2015-07-10 DIAGNOSIS — R300 Dysuria: Secondary | ICD-10-CM | POA: Diagnosis not present

## 2015-07-10 LAB — POC URINALSYSI DIPSTICK (AUTOMATED)
BILIRUBIN UA: NEGATIVE
GLUCOSE UA: NEGATIVE
Ketones, UA: NEGATIVE
NITRITE UA: NEGATIVE
PH UA: 6
SPEC GRAV UA: 1.025
UROBILINOGEN UA: 0.2

## 2015-07-10 MED ORDER — CEPHALEXIN 250 MG PO CAPS: 250.0000 mg | ORAL_CAPSULE | Freq: Two times a day (BID) | ORAL | Status: DC

## 2015-07-10 NOTE — Addendum Note (Signed)
Addended by: Shon MilletWATLINGTON, Emerson Barretto M on: 07/10/2015 03:14 PM   Modules accepted: Orders

## 2015-07-10 NOTE — Telephone Encounter (Signed)
Rx sent to pharmacy and pt advise of Dr. Royden Purlower's comments and verbalized understanding

## 2015-07-10 NOTE — Telephone Encounter (Signed)
Tell her ua is pos -likely uti Please call in keflex  See gyn tomorrow as planned -make sure she lets them know

## 2015-07-11 LAB — URINE CULTURE
COLONY COUNT: NO GROWTH
ORGANISM ID, BACTERIA: NO GROWTH

## 2015-08-16 ENCOUNTER — Telehealth (HOSPITAL_COMMUNITY): Payer: Self-pay | Admitting: Lactation Services

## 2015-08-16 NOTE — Telephone Encounter (Signed)
Mom called and left message and I called her back. Reports baby is over 765 [name] [name] old and still having trouble with breast feeding and bottle feeding. Mom has sore nipples. 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 OP appointment made for Thursday 5/4 at 1pm

## 2015-08-17 ENCOUNTER — Ambulatory Visit (HOSPITAL_COMMUNITY)
Admission: RE | Admit: 2015-08-17 | Discharge: 2015-08-17 | Disposition: A | Discharge status: Home / Self Care | Payer: 59 | Source: Ambulatory Visit | Attending: Obstetrics and Gynecology | Admitting: Obstetrics and Gynecology

## 2015-08-17 NOTE — Lactation Note (Signed)
Lactation Consult  Mother's reason for visit:  Per mom poor latch , mom blistering, baby choking, and excessive drooling  Visit Type:  Feeding assessment  Appointment Notes:  SN's , trouble with BF , and Bottle feeding. Pt. Confirmed appt. For 5/4  Consult:  Follow-Up Lactation Consultant:  Kathrin GreathouseIorio, Tahmid Stonehocker Ann  ________________________________________________________________________ Joan FloresBaby's Name: Frazier ButtKennedy Elizabeth Palmer Date of Birth: 07/04/2017 Pediatrician: Dr. Milinda [facility]  Gender: female Gestational Age: 1643w1d (At Birth) Birth Weight: 7 lb 0.5 oz (3190 g) Weight at Discharge: Weight: 6 lb 10.4 oz (3016 g)Date of Discharge: 07/08/2015 Filed Weights   07/05/15 2317 07/07/15 0052 07/07/15 2335  Weight: 6 lb 8.9 oz (2975 g) 6 lb 6.7 oz (2910 g) 6 lb 10.4 oz (3016 g)   Last weight taken from location outside of [facility]: 4/17 7-0 oz at 4 weeks  Location:Pediatrician's office Weight today: 8.8.2 oz , 3860 g   ___________________________________________________________________  Mother's Name: Heavenlee Hayes-Palmer Type of delivery:  C/section  Breastfeeding Experience:  Per mom difficult from the start , I mostly pump and feed her from a bottle .  I do nurse her ( try ) her before bed and when she not to hungry - mostly just to bond and comfort her to sleep.  Maternal Medical Conditions:  No risk for milk supply , mom reports multiply breast changes with pregnancy  Maternal Medications:  PNV   ________________________________________________________________________  Breastfeeding History (Post Discharge)  Frequency of breastfeeding:  Once of twice a day  Duration of feeding:  25 -30 mins   Supplementing: per mom with breast milk or formula ( which ever one is available )  2-5 oz a feeding with a Dr.  Manson PasseyBrown nipple ( baby does ok , some gagging )    Pumping: DEBP Charolotte CapuchinFreebie average 3-5 oz , best 7 once today  ( 2-4 hours )    Infant Intake and Output Assessment  Voids:  10  in 24 hrs.  Color:  Clear yellow Stools:  5 in 24 hrs.  Color:  Yellow  ________________________________________________________________________  Maternal Breast Assessment  Breast:  Full Nipple:  Erect Pain level:  0 Pain interventions:  Expressed breast milk  _______________________________________________________________________ Feeding Assessment/Evaluation  Initial feeding assessment:  Infant's oral assessment:  Variance - see note below   Prior to the latch - fed the baby 30 ml appetizer due to coming in very hungry and fussy  Mom reported baby doesn't latch well when really hungry  Positioning:  Football  Left breast   LATCH documentation:  Latch:  2 = Grasps breast easily, tongue down, lips flanged, rhythmical sucking.  Audible swallowing:  2 = Spontaneous and intermittent  Type of nipple:  2 = Everted at rest and after stimulation  Comfort (Breast/Nipple):  1 = Filling, red/small blisters or bruises, mild/mod discomfort  Hold (Positioning):  1 = Assistance needed to correctly position infant at breast and maintain latch  LATCH score: 8   Attached assessment:  Deep to start and then baby pulled back and off 5 times during a 10 -12 mins feeding   Lips flanged:  No.  Lips untucked:  Yes.    Suck assessment:  Nutritive and Nonnutritive  Tools:  None  Instructed on use and cleaning of tool:  No.  Pre-feed weight:  3860 g , 8-8.2 oz Post-feed weight:  3914 g , 8-10.0 oz  Amount transferred: 25 ml  Amount supplemented:  29 ml  Total = 54 ml  Additional Feeding Assessment -   Infant's oral assessment:  Variance - see note below   Positioning:  Cross cradle Right breast  LATCH documentation:  Latch:  2 = Grasps breast easily, tongue down, lips flanged, rhythmical sucking.  Audible swallowing:  2 = Spontaneous and intermittent  Type of nipple:  2 = Everted at rest and after stimulation  Comfort  (Breast/Nipple):  1 = Filling, red/small blisters or bruises, mild/mod discomfort  Hold (Positioning):  1 = Assistance needed to correctly position infant at breast and maintain latch  LATCH score:  8   Attached assessment:  Deep  - pulled off 3 times at this breast but sustained latch longer than the other breast   Lips flanged:  Yes.    Lips untucked:  Yes.    Suck assessment:  Nutritive  Tools:  None  Instructed on use and cleaning of tool:  No   Pre-feed weight:  3914 g , 8-10.0 oz  Post-feed weight:  3932 g , 8-10.7 oz  Amount transferred: 18 ml  Amount supplemented:  After 2nd latch baby only took sips    Total amount pumped post feed: did not have time to pump   Total amount transferred:  43 ml  Total supplement given:  29 ml  Total Volume = 72 ml    Lactation Impression :  Baby - low weight gain  38 weeks old  If baby was gaining 1oz a day since moms milk came in the baby should be at 9.1 oz  Today 's weight - 8-8.2 oz ( 3860 g )  Weight is ok  @ consult baby transferred 43 ml total off both breast and was supplemented 29 ml of formula ( mom forgot her EBM )  @ consult ran out of time to post pump - to determine milk supply  Per mom had last pumped at 10:00 am and obtained 7 oz off both breast  ( average pumping per session 3-5 oz )  Oral variance - LC suspects the baby has a tongue - tie - resource sheet hand out given to mom  And highly recommended to consider the abby being assessed by the oral specialist .  It is not normal for a baby at 6 weeks of life to pull off the breast ( 3-5 times at to different latches) when the mother has  excellent nipple tissue and milk supply  for latching unless there is an issue with the tongue mobility. Baby extends tongue  Over gum line , elevates tongue, but when sucking on a LC's gloved finger noted the back of the tongue humping intermittently  Every few sucks. Also baby has a very sensitive gag reflex with exam at the breast and  bottle.  Mom seemed very concerned that the baby chokes easily and spitting.  LC concerns that the baby should beable to have a better seal at the breast when latch for a 28 week old.  See LC plan below     Lactation Plan of Care:  Praised mom for all her hard work breast feeding and pumping Breast feeding goals - protect established milk supply - with breast feeding and extra pumping  Feedings- at least 8 or greater a day  Option #1  If Kyung Rudd is due to feed and calm and not overly hungry - latch w/ breast compressions until swallows  and then intermittent , supplement afterwards if needed. ( can offer both breast , then supplement.  Option #2  Kyung Rudd hungry  and ready to feed - may need to give her an appetizer 30 ml , then latch with compressions  Until swallows and then intermittent. ( offer both breast) Supplement afterwards if needed  Option #3  Feed the feeding from the bottle - 2-5 oz and then pump both breast for 15 - 20 mins.  F/U PRN with LC office  LC highly recommended to mom to check out Tongue - Tie resources given at consult  And to have her Pedis assess the baby's tongue mobility and to consider having baby's oral cavity assessed by a oral specialist

## 2015-08-31 ENCOUNTER — Ambulatory Visit (HOSPITAL_COMMUNITY)
Admission: RE | Admit: 2015-08-31 | Discharge: 2015-08-31 | Disposition: A | Discharge status: Home / Self Care | Payer: 59 | Source: Ambulatory Visit | Attending: Obstetrics and Gynecology | Admitting: Obstetrics and Gynecology

## 2015-08-31 NOTE — Lactation Note (Signed)
Lactation Consult  Mother's reason for visit:  Follow up from a posterior tongue tie revision Visit Type: Feeding assessment  Appointment Notes: Mother her for follow up post revision. Kennedy has tongue revision on MaKyung Rudd, 10 days ago at [facility].  Mother states that breastfeeding is going better. She states that she is still having nipple pain 50% of the time. She states that her nipple feel like razor blades when Kyung Rudd first latches on. Mother describes itching and burning pain.  Mother states that Kyung Rudd sometimes arched and pulls back as it she is frustrated, especially in the evening.   Consult:  Initial Lactation Consultant:  Michel Bickers  ________________________________________________________________________  Joan Flores Name: Lenda Kelp Date of Birth: 1991-03-27 Pediatrician: Dr Milinda Antis Gender: female Gestational Age: <None> (At Birth) Birth Weight: 7-0 Weight at Discharge: Date of Discharge:  There were no vitals filed for this visit. Last weight taken from location [facility]: Lactation office, on May 4, weight 8-8.2,3860 Weight today:9-1.3,4120     Admission Information     Provider Service Admission Date      08/31/2015          ADT Events       Unit Room Bed Service Event    08/31/15 0856 [facility] Out[facility]      Weight Information (since admission)     None     Weights    Go to now       06/07/15 - Today         One Day Eight Hours  View All      Time:                ________________________________________________________________________  Mother's Name: Brita Hayes-Palmer Type of delivery:  C/S Breastfeeding Experience:  none Maternal Medical Conditions:  Infertility, Polycystic kidney disease, IBS Maternal Medications: Prenatal  vits  ________________________________________________________________________  Breastfeeding History (Post Discharge)  Frequency of breastfeeding:every 2 hours Duration of feeding: 30-60 mins    Pumping  Type of pump:  Freemie  Frequency:  Once daily Volume:  2 ounces  Infant Intake and Output Assessment  Voids: 10-12 24 hours.  Color:  Clear yellow- Stools:  4-5  24 hrs.  Color:  Yellow  ________________________________________________________________________  Maternal Breast Assessment  Breast:  Full Nipple:  Erect Pain level:  2 Pain interventions:  Bra  _______________________________________________________________________ Feeding Assessment/Evaluation:  Mother has deep pink nipple tissue that extends 1/4 inch back on the areola. No noted cracking. Mother has a long history of yeast. She was advised to treat for yeast symptoms on her nipples,.  Kennedy latched on with a shallow latch not taking in the whole nipple.  Her top lip flanges but not well. When attempting to adjust infants lower jaw for wider gape she pops off the breast. Several attempts to latch before Kyung Rudd sustained latch. Infant fed for 20-25 mins on the (R) breast.   Observed that revision healed. Mother continues to do tongue stretches.   Infant's oral assessment:  Variance   Positioning:  Cradle Right breast  LATCH documentation:  Latch:  2 = Grasps breast easily, tongue down, lips flanged, rhythmical sucking.  Audible swallowing:  2 = Spontaneous and intermittent  Type of nipple:  2 = Everted at rest and after stimulation  Comfort (Breast/Nipple):  1 = Filling, red/small blisters or bruises, mild/mod discomfort  Hold (Positioning):  1 = Assistance needed to correctly position infant at breast and maintain latch  LATCH score:  8  Attached assessment:  Deep  Lips flanged:  Yes.    Lips untucked:  Yes.    Suck assessment:  Displays both  Pre-feed weight:  4120, 9-1.3 Post-feed  weight: 4178, 9-3.3 Amount transferred:  58 ml   Advised mother to continue to feed infant on demand. Post pump 1-2 times daily for 15-20 mins Advised mother to treat for yeast symptoms for 2 weeks after symptoms gone. Suggested that mother follow up in one week for weight check , BFSG  Peds visit in 2 week for weight check and well baby visit.

## 2015-09-18 ENCOUNTER — Telehealth: Payer: Self-pay

## 2015-09-18 NOTE — Telephone Encounter (Signed)
What kind of symptoms is she having.

## 2015-09-18 NOTE — Telephone Encounter (Signed)
Pt left v/m; pt using (sounds like nystatin) for yeast for nursing and that is not working. What else could try. Pt request cb. [facility].

## 2015-09-19 MED ORDER — MICONAZOLE NITRATE 2 % EX CREA: 1.0000 "application " | TOPICAL_CREAM | Freq: Two times a day (BID) | CUTANEOUS | Status: DC

## 2015-09-19 NOTE — Telephone Encounter (Signed)
Pt said she saw a Lactation consultant last week who told her she had a yeast inf., pt said both breast are painful when nursing nursing but it's just the skin not inside breast that hurts, she said the regular skin right around her areola is pink, shiny, and it looks like she has razor bumps there. Pt said both breast feel like they are burning and occasionally itchy. Pt said she has had really good luck with diflucan in the past for vaginal yeast inf and she wasn't sure if that's an option for this type of yeast infection if not she is willing to try what you recommend

## 2015-09-19 NOTE — Telephone Encounter (Signed)
Miconazole is recommended next - we do not use diflucan unless topicals do not work  I am sending px to her pharmacy -it is also otc so if ins does not cover she can get otc

## 2015-09-19 NOTE — Telephone Encounter (Signed)
Pt notified of Rx sent to pharmacy and advise of Dr. Royden Purlower's comments and verbalized understanding

## 2015-09-22 MED ORDER — FLUCONAZOLE 150 MG PO TABS: ORAL_TABLET | ORAL | Status: DC

## 2015-09-22 NOTE — Telephone Encounter (Signed)
Spoke to pt and advised per Dr Tower.  

## 2015-09-22 NOTE — Telephone Encounter (Signed)
I [facility] if not helpful

## 2015-09-22 NOTE — Telephone Encounter (Signed)
Pt said Miconazole is not working; no improvement yet and with pt trying to breast feed pt is miserable and pt request cb with what can be done next. [facility].

## 2015-09-22 NOTE — Addendum Note (Signed)
Addended by: Roxy MannsWER, Quy Lotts A on: 09/22/2015 01:38 PM   Modules accepted: Orders

## 2015-10-06 IMAGING — CR DG ABDOMEN 3V
1 series · 3 of 3 positions shown · non-contrast
Comparison: None.

CLINICAL DATA: Initial evaluation for acute shortness of breath,
constipation.

EXAM:
ABDOMEN SERIES

[Series 1: w chest pa · 0.14mm/px · 3 of 3 slices shown]
[im 1/3]
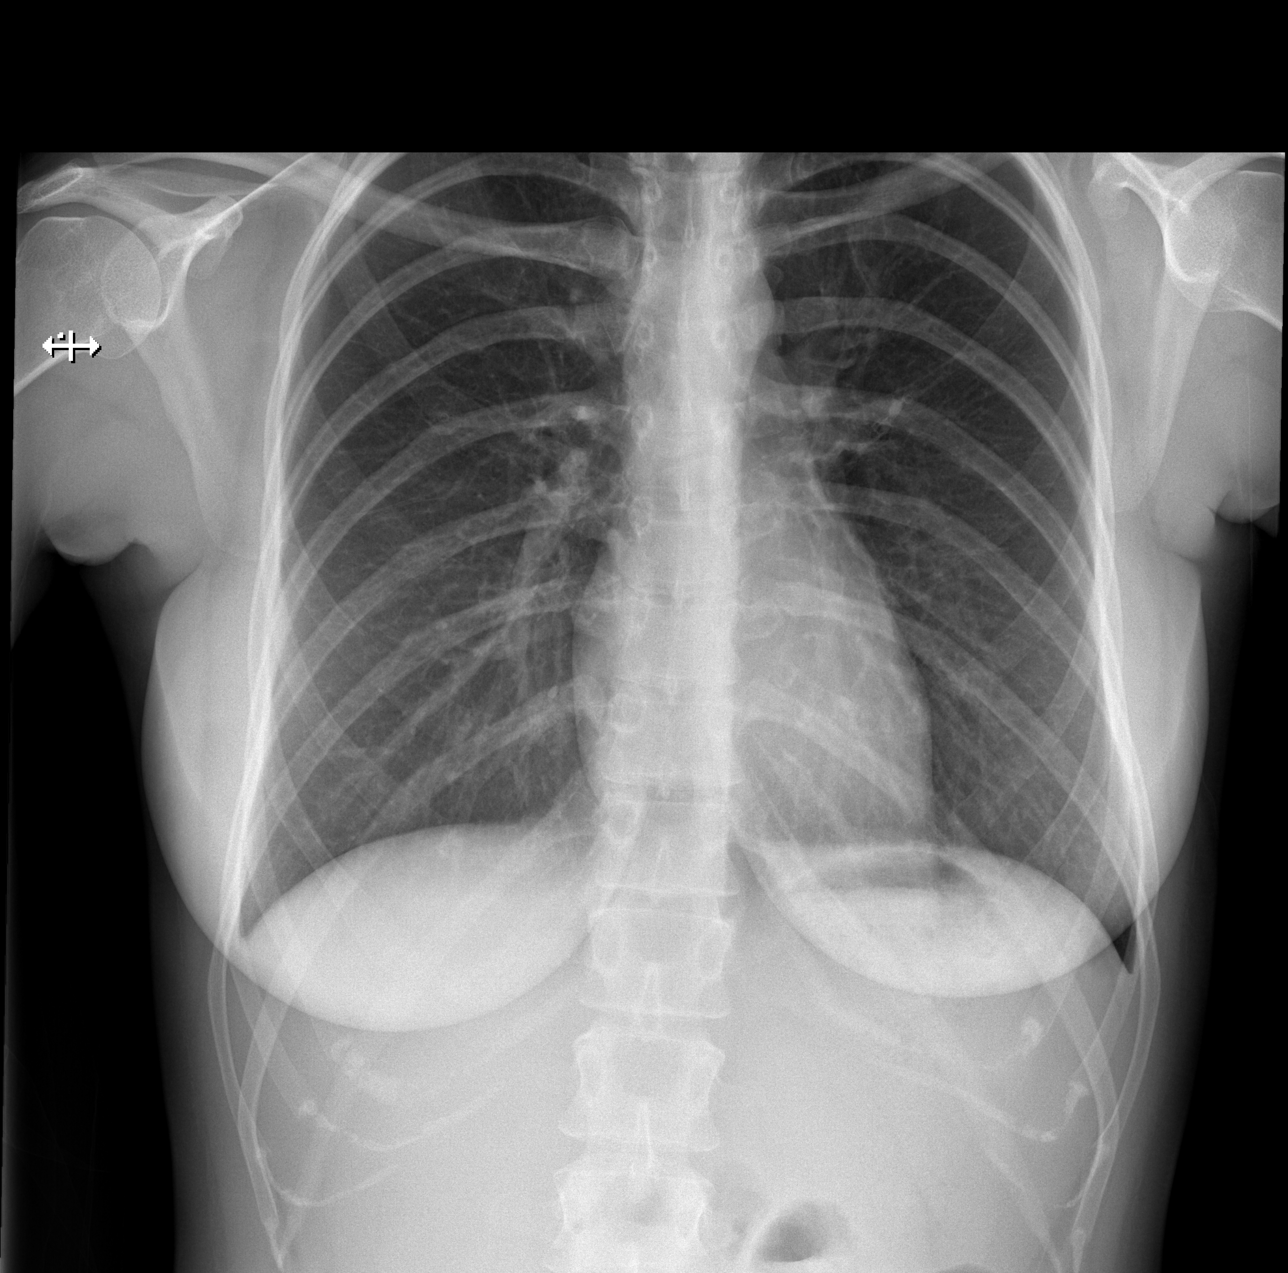
[im 2/3]
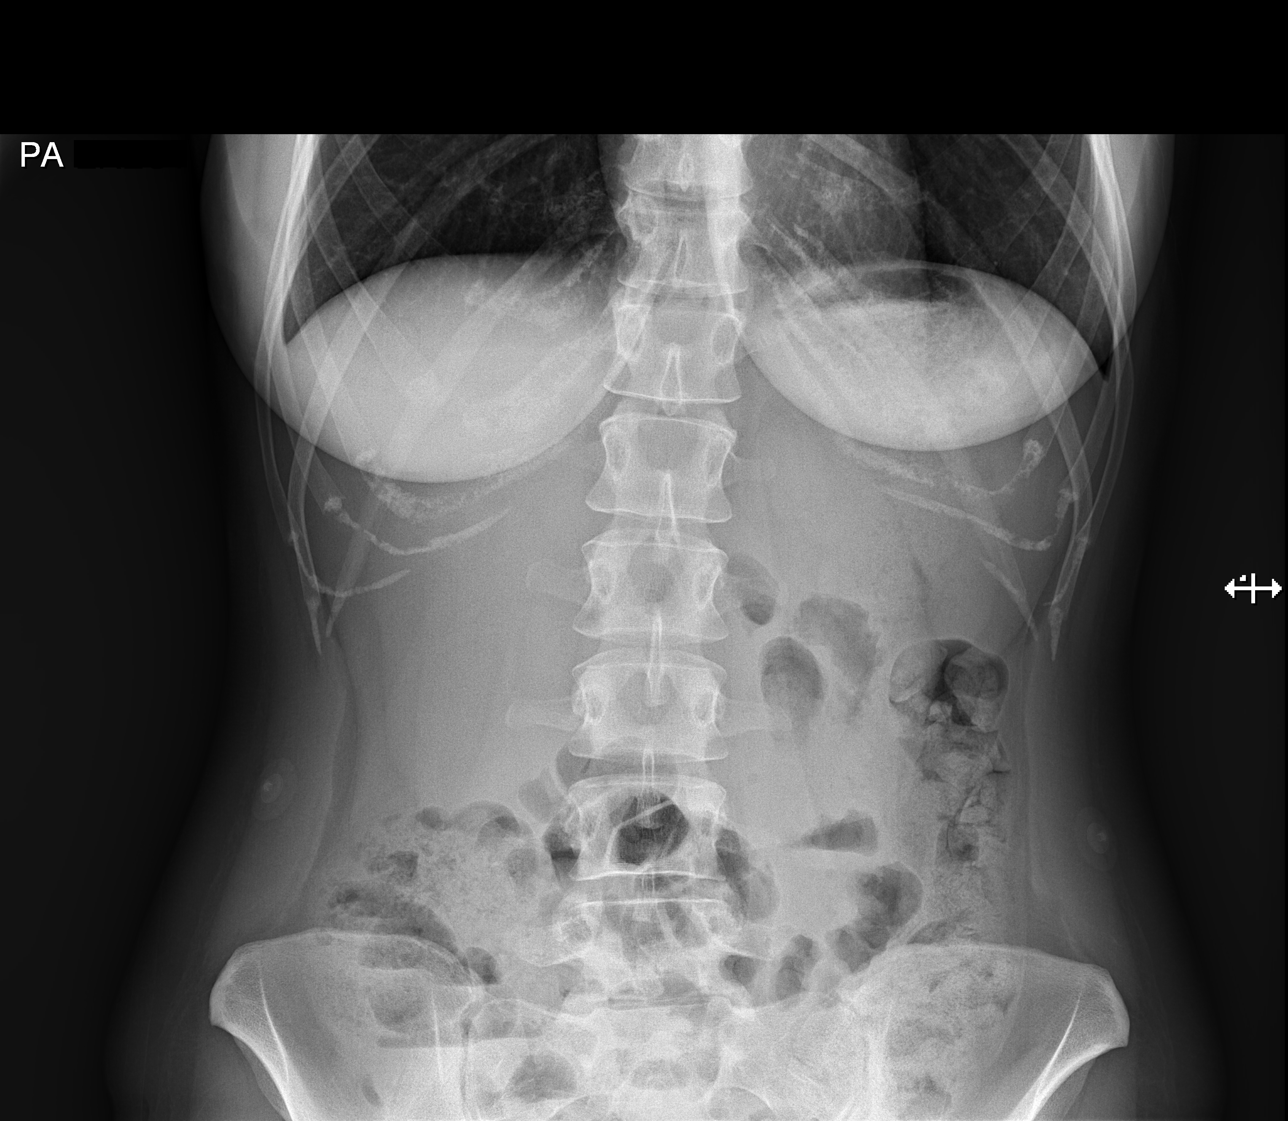
[im 3/3]
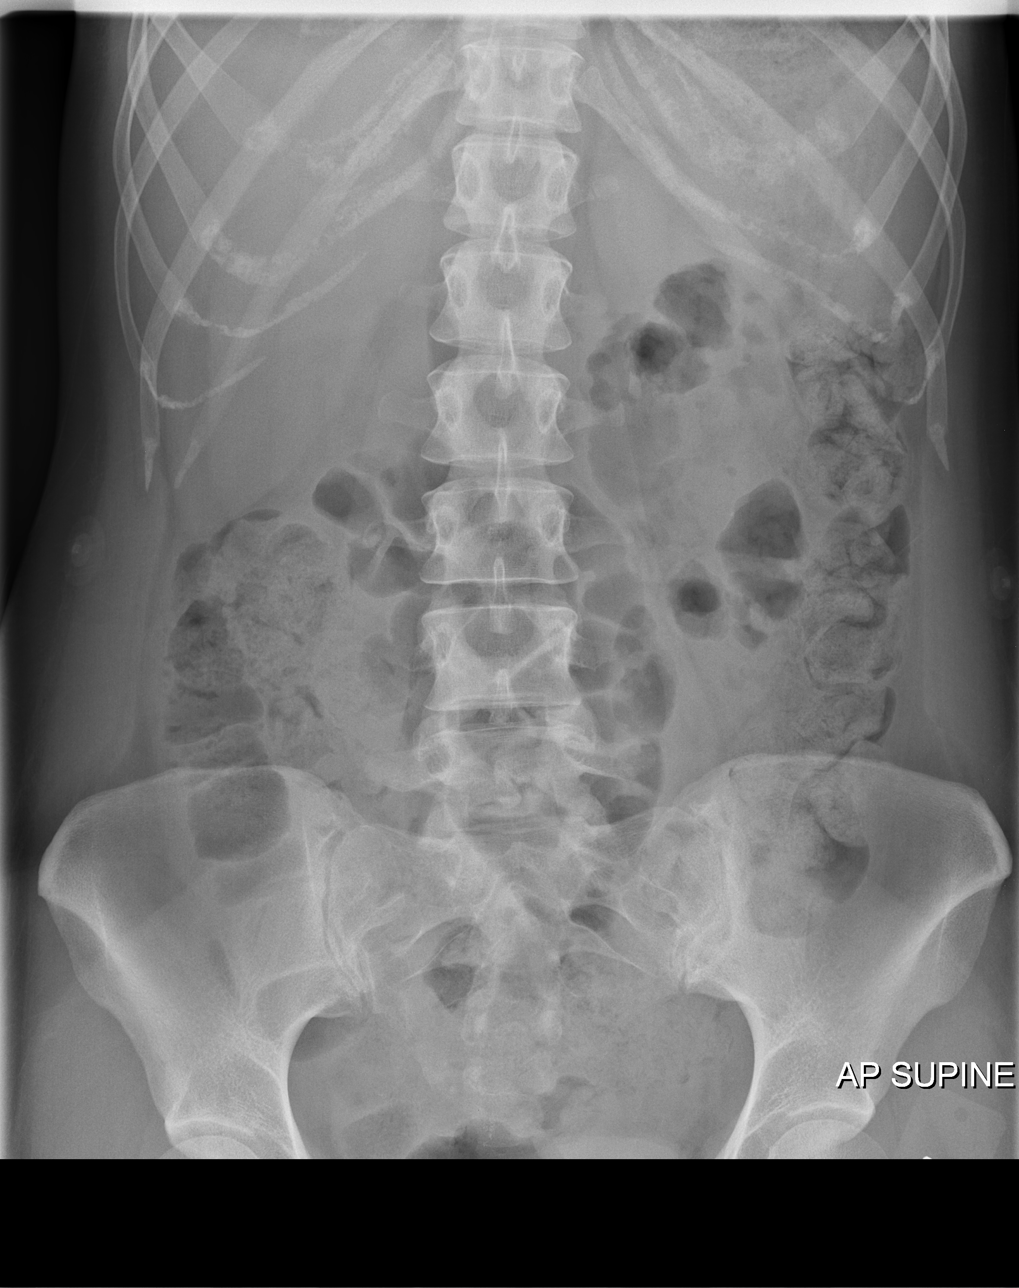

[3 of 3 positions shown; findings below may reference images not displayed]

FINDINGS: The cardiac and mediastinal silhouettes are within normal limits.

The lungs are normally inflated. No airspace consolidation, pleural
effusion, or pulmonary edema is identified. There is no
pneumothorax.

No acute osseous abnormality identified.

Bowel gas pattern within normal limits without evidence for
obstruction or ileus. Moderate amount of retained stool within the
colon. No abnormal bowel wall thickening. No free air.

No soft tissue mass or abnormal calcification.

No acute osseus abnormality.
IMPRESSION: 1. Nonobstructive bowel gas pattern with no radiographic evidence
for acute intra-abdominal process.
2. Moderate amount of retained stool within the colon, suggesting
constipation.
3. No active cardiopulmonary disease.

## 2015-10-19 ENCOUNTER — Telehealth: Payer: Self-pay | Admitting: Family Medicine

## 2015-10-19 NOTE — Telephone Encounter (Signed)
Patient Name: [name] [name]  DOB: 08/07/1990    Initial Comment Caller has been having periodic dizziness    Nurse Assessment  Nurse: Nicanor Bakeosta, RN, Marylene LandAngela Date/Time (Eastern Time): 10/19/2015 2:32:33 PM  Confirm and document reason for call. If symptomatic, describe symptoms. You must click the next button to save text entered. ---Caller has been having perodic periods of dizziness and has a history of anemia (on MVI's for it)  Has the patient traveled out of the country within the last 30 days? ---No  Does the patient have any new or worsening symptoms? ---Yes  Will a triage be completed? ---Yes  Related visit to physician within the last 2 weeks? ---No  Does the PT have any chronic conditions? (i.e. diabetes, asthma, etc.) ---Yes  List chronic conditions. ---anemia, polycystic kidney disease  Is the patient pregnant or possibly pregnant? (Ask all females between the ages of 7112-55) ---No  Is this a behavioral health or substance abuse call? ---No     Guidelines    Guideline Title Affirmed Question Affirmed Notes  Dizziness - Lightheadedness [1] MODERATE dizziness (e.g., interferes with normal activities) AND [2] has NOT been evaluated by physician for this (Exception: dizziness caused by heat exposure, sudden standing, or poor fluid intake)   Knee Pain [1] Thigh or calf pain AND [2] only 1 side AND [3] present > 1 hour    Final Disposition User   See Physician within 4 Hours (or PCP triage) [place], RN, Marylene LandAngela    Comments  During triage process- she states is also having pain in the back/side of the right knee. She is 3 mo post partum   Referrals  GO TO FACILITY UNDECIDED  GO TO FACILITY UNDECIDED  GO TO FACILITY UNDECIDED   Disagree/Comply: Comply    Disagree/Comply: Comply     Extra note not on original record-  She states she is [place] and not sure where she will go for evaluation.

## 2015-10-19 NOTE — Telephone Encounter (Signed)
Please check in with her tomorrow.

## 2015-10-20 NOTE — Telephone Encounter (Signed)
Left voicemail requesting pt to call office back 

## 2015-11-02 NOTE — Telephone Encounter (Signed)
Pt saw her GYN and she is feeling better they are doing some blood work to check her iron but she feels better so doesn't need to f/u with our office

## 2015-12-22 ENCOUNTER — Ambulatory Visit (INDEPENDENT_AMBULATORY_CARE_PROVIDER_SITE_OTHER): Payer: 59 | Admitting: Family Medicine

## 2015-12-22 ENCOUNTER — Encounter: Payer: Self-pay | Admitting: Family Medicine

## 2015-12-22 VITALS — BP 98/60 | HR 98 | Temp 98.5°F | Wt 150.8 lb

## 2015-12-22 DIAGNOSIS — J069 Acute upper respiratory infection, unspecified: Secondary | ICD-10-CM | POA: Diagnosis not present

## 2015-12-22 MED ORDER — AMOXICILLIN 875 MG PO TABS: 875.0000 mg | ORAL_TABLET | Freq: Two times a day (BID) | ORAL | 0 refills | Status: DC

## 2015-12-22 MED ORDER — HYDROCOD POLST-CPM POLST ER 10-8 MG/5ML PO SUER: 5.0000 mL | Freq: Every evening | ORAL | 0 refills | Status: DC | PRN

## 2015-12-22 NOTE — Patient Instructions (Addendum)
For nasal congestion you can use Afrin nasal spray for 3 days max, Sudafed, saline nasal spray (generic is fine for all). Restart your daily antihistamine and can use Flonase twice a day for a week- take after afrin For cough you can try Delsym. Drink enough fluids to make your urine light yellow. For fever/chill/muscle aches you can take over the counter acetaminophen or ibuprofen.   If you are not better in 7 days, may start antibiotic  Please come back in if you are not better in 5-7 days or if you develop wheezing, shortness of breath or persistent vomiting.

## 2015-12-22 NOTE — Progress Notes (Signed)
Subjective:    Patient ID: [name] [name], female    DOB: 02/05/1991, 25 y.o.   MRN: [id number]  HPI This is a 25 yo female who presents today with 3 days of sore throat, muscle aches, clear nasal drainage. Cough productive of thick yellow sputum. No SIB, no wheeze. Feels fatigued.  Restarted her flonase yesterday. Took Nyquil last night with some relief. Is nursing her 826 month old once a day. Travels with her job with [employer]. Has been around someone with strep throat in the last 2 weeks.  Past Medical History:  Diagnosis Date  . Anxiety   . Asthma   . Cervical polyp   . Chocolate cyst of ovary    R ovary  . Complication of anesthesia    difficult to wake up   . 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 Endometriosis   . History of asthma    mild  . History of diverticulosis   . History of hay fever   . History of kidney disease   . History of kidney stones   . History of UTI   . Hx of varicella   . IBS (irritable bowel syndrome)   . PONV (postoperative nausea and vomiting)   . Vaginal Pap smear, abnormal    Past Surgical History:  Procedure Laterality Date  . ABLATION ON ENDOMETRIOSIS  2011  . APPENDECTOMY  2011  . CESAREAN SECTION N/A 07/04/2015   Procedure: CESAREAN SECTION;  Surgeon: Mitchel HonourMegan Morris, DO;  Location: [facility];  Service: Obstetrics;  Laterality: N/A;  . DIAGNOSTIC LAPAROSCOPY    . KIDNEY SURGERY  1998, 2007, 2011   Blockage Removal  . SHOULDER SURGERY  2007   Family History  Problem Relation Age of Onset  . Ovarian cancer Mother   . Uterine cancer Mother   . Mitral valve prolapse Mother   . Cancer Mother     ovarian and skin  . Stroke Father     blod clot at base of brain  . Sudden death Father   . Hypertension Father   . Diabetes Maternal Grandmother   . Thyroid disease Maternal Grandmother   . Rheum arthritis Paternal Grandmother   . Kidney disease Paternal Grandmother     polycystic kidney  disease    . Muscular dystrophy Cousin   . Spina bifida Maternal Aunt    Social History  Substance Use Topics  . Smoking status: Never Smoker  . Smokeless tobacco: Never Used  . Alcohol use 0.0 oz/week     Comment: wine -occ      Review of Systems Per HPI    Objective:   Physical Exam  Constitutional: She is oriented to person, place, and time. She appears well-developed and well-nourished. No distress.  HENT:  Head: Normocephalic and atraumatic.  Right Ear: External ear and ear canal normal. Tympanic membrane is scarred.  Left Ear: Tympanic membrane, external ear and ear canal normal.  Nose: Mucosal edema and rhinorrhea present.  Mouth/Throat: Mucous membranes are normal. Posterior oropharyngeal erythema present. No oropharyngeal exudate or posterior oropharyngeal edema.  Cardiovascular: Normal rate, regular rhythm and normal heart sounds.   Pulmonary/Chest: Effort normal and breath sounds normal.  Occasional dry hacking cough  Musculoskeletal: Normal range of motion.  Neurological: She is alert and oriented to person, place, and time.  Skin: Skin is warm and dry. She is not diaphoretic.  Psychiatric: She has a normal mood and affect. Her  behavior is normal. Judgment and thought content normal.  Vitals reviewed.        BP 98/60   Pulse 98   Temp 98.5 F (36.9 C)   Wt 150 lb 12.8 oz (68.4 kg)   SpO2 97%   BMI 25.88 kg/m  Wt Readings from Last 3 Encounters:  12/22/15 150 lb 12.8 oz (68.4 kg)  06/27/15 173 lb 14.4 oz (78.9 kg)  06/09/15 163 lb 6 oz (74.1 kg)    Assessment & Plan:  1. Upper respiratory infection with cough and congestion - likely viral, provided symptomatic treatment suggestions (reviwed effects on lactation with patient), RTC precautions reviewed, provided wait and see antibiotic if not improved in 5 days - chlorpheniramine-HYDROcodone (TUSSIONEX PENNKINETIC ER) 10-8 MG/5ML SUER; Take 5 mLs by mouth at bedtime as needed for cough.  Dispense: 70 mL; Refill:  0 - amoxicillin (AMOXIL) 875 MG tablet; Take 1 tablet (875 mg total) by mouth 2 (two) times daily.  Dispense: 14 tablet; Refill: 0   Olean Ree, FNP-BC  [facility], [facility]  12/22/2015 2:15 PM

## 2016-01-18 ENCOUNTER — Ambulatory Visit (INDEPENDENT_AMBULATORY_CARE_PROVIDER_SITE_OTHER): Payer: 59 | Admitting: Internal Medicine

## 2016-01-18 ENCOUNTER — Encounter: Payer: Self-pay | Admitting: Internal Medicine

## 2016-01-18 VITALS — BP 112/62 | HR 81 | Resp 18

## 2016-01-18 DIAGNOSIS — Z91018 Allergy to other foods: Secondary | ICD-10-CM | POA: Diagnosis not present

## 2016-01-18 DIAGNOSIS — T783XXA Angioneurotic edema, initial encounter: Secondary | ICD-10-CM | POA: Diagnosis not present

## 2016-01-18 MED ORDER — EPINEPHRINE 0.3 MG/0.3ML IJ SOAJ: 0.3000 mg | Freq: Once | INTRAMUSCULAR | 0 refills | Status: AC

## 2016-01-18 NOTE — Patient Instructions (Signed)
Food Allergy  A food allergy is an abnormal reaction to a food (food allergen) by the body's defense system (immune system). Foods that commonly cause allergies are:  · Milk.  · Seafood.  · Eggs.  · Nuts.  · Wheat.  · Soy.  CAUSES  Food allergies happen when the immune system mistakenly sees a food as harmful and releases antibodies to fight it.  SIGNS AND SYMPTOMS  Symptoms may be mild or severe. They usually start minutes after the food is eaten, but they can occur even a few hours later. In people with a severe allergy, symptoms can start within seconds.  Mild Symptoms  · Nasal congestion.  · Tingling in the mouth.  · An itchy, red rash.  · Vomiting.  · Diarrhea.  Severe Symptoms  · Swelling of the lips, face, and tongue.  · Swelling of the back of the mouth and throat.  · Wheezing.  · A hoarse voice.  · Itchy, red, swollen areas of skin (hives).  · Dizziness or light-headedness.  · Fainting.  · Trouble breathing, speaking, or swallowing.  · Chest tightness.  · Rapid heartbeat.  DIAGNOSIS  A diagnosis is made with a physical exam, medical and family history, and one or more of the following:  · Skin tests.  · Blood tests.  · A food diary.  · The results of an elimination diet. The elimination diet involves removing foods from your diet and then adding them back in, one at a time.  TREATMENT  There is no cure for allergies. An allergic reaction can be treated with medicines, such as:  · Antihistamines.  · Steroids.  · Respiratory inhalers.  · Epinephrine.  Severe symptoms can be a sign of a life-threatening reaction called anaphylaxis, and they require immediate treatment. Severe reactions usually need to be treated at a hospital. People who have had a severe reaction may be prescribed rescue medicines to take if they are accidentally exposed to an allergen.  HOME CARE INSTRUCTIONS  General Instructions  · Avoid the foods that you are allergic to.  · Read food labels before you eat packaged items. Look for  ingredients you are allergic to.  · When you are at a restaurant, tell your server that you have an allergy. If you are unsure of whether a meal has an ingredient that you are allergic to, ask your server.  · Take medicines only as directed by your health care provider. Do not drive until the medicine has worn off, unless your health care provider gives you approval.  · Inform all health care providers that you have a food allergy.  · Contact your health care provider if you want to be tested for an allergy. If you have had an anaphylactic reaction before, you should never test yourself for an allergy without your health care provider's approval.  Instructions for People with Severe Allergies  · Wear a medical alert bracelet or necklace that describes your allergy.  · Carry your anaphylaxis kit or an epinephrine injection with you at all times. Use them as directed by your health care provider.  · Make sure that you, your family members, and your employer know:    How to use an anaphylaxis kit.    How to give an epinephrine injection.  · Replace your epinephrine immediately after use, in case you have another reaction.  · Seek medical care even after you take epinephrine. This is important because epinephrine can be followed by a delayed,   life-threatening reaction.  Instructions for People with a Potential Allergy  · Follow the elimination diet as directed by your health care provider.  · Keep a food diary as directed by your health care provider. Every day, write down:    What you eat and drink and when.    What symptoms you have and when.  SEEK MEDICAL CARE IF:  · Your symptoms have not gone away within 2 days.  · Your symptoms get worse.  · You develop new symptoms.  SEEK IMMEDIATE MEDICAL CARE IF:  · You use epinephrine.  · You are having a severe allergic reaction. Symptoms of a severe reaction include:    Swelling of the lips, face, and tongue.    Swelling of the back of the mouth and throat.    Wheezing.    A  hoarse voice.    Hives.    Dizziness or light-headedness.    Fainting.    Trouble breathing, speaking, or swallowing.    Chest tightness.    Rapid heartbeat.     This information is not intended to replace advice given to you by your health care provider. Make sure you discuss any questions you have with your health care provider.     Document Released: 03/29/2000 Document Revised: 04/22/2014 Document Reviewed: 01/11/2014  Elsevier Interactive Patient Education ©2016 [employer]

## 2016-01-18 NOTE — Progress Notes (Signed)
Subjective:    Patient ID: [name] [name], female    DOB: 02/06/1991, 25 y.o.   MRN: [id number]  HPI  Pt presents to the clinic today with c/o a possible food allergy.  She reports she was feeding her daughter pears, when all of a sudden she noticed swelling and tingling around her mouth. She denied difficulty swallowing, throat swelling or shortness of breath. She did take Benadryl with good relief. She reports her brother is allergic to pears and both of her parents had food allergies. She reports she was allergy tested as a child. She knows she is allergic to dust and pollen. She does not know that she was test for food allergies. She is requesting an Epipen today due to the severity of her families food allergies.   Review of Systems      Past Medical History:  Diagnosis Date  . Anxiety   . Asthma   . Cervical polyp   . Chocolate cyst of ovary    R ovary  . Complication of anesthesia    difficult to wake up   . 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 Endometriosis   . History of asthma    mild  . History of diverticulosis   . History of hay fever   . History of kidney disease   . History of kidney stones   . History of UTI   . Hx of varicella   . IBS (irritable bowel syndrome)   . PONV (postoperative nausea and vomiting)   . Vaginal Pap smear, abnormal     Current Outpatient Prescriptions  Medication Sig Dispense Refill  . amoxicillin (AMOXIL) 875 MG tablet Take 1 tablet (875 mg total) by mouth 2 (two) times daily. 14 tablet 0  . cephALEXin (KEFLEX) 250 MG capsule Take 1 capsule (250 mg total) by mouth 2 (two) times daily. 14 capsule 0  . chlorpheniramine-HYDROcodone (TUSSIONEX PENNKINETIC ER) 10-8 MG/5ML SUER Take 5 mLs by mouth at bedtime as needed for cough. 70 mL 0  . fluconazole (DIFLUCAN) 150 MG tablet Take one pill every other day for 3 doses 3 tablet 0  . ibuprofen (ADVIL,MOTRIN) 600 MG tablet Take 1 tablet (600 mg  total) by mouth every 6 (six) hours as needed. 30 tablet 0  . miconazole (MICOTIN) 2 % cream Apply 1 application topically 2 (two) times daily. To affected areas 28.35 g 0  . Multiple Vitamin (MULTIVITAMIN) tablet Take 1 tablet by mouth daily.    Marland Kitchen. oxyCODONE-acetaminophen (PERCOCET/ROXICET) 5-325 MG tablet Take 1-2 tablets by mouth every 6 (six) hours as needed for moderate pain. 30 tablet 0   No current facility-administered medications for this visit.     No Known Allergies  Family History  Problem Relation Age of Onset  . Stroke Father     blod clot at base of brain  . Sudden death Father   . Hypertension Father   . Spina bifida Maternal Aunt   . Ovarian cancer Mother   . Uterine cancer Mother   . Mitral valve prolapse Mother   . Cancer Mother     ovarian and skin  . Diabetes Maternal Grandmother   . Thyroid disease Maternal Grandmother   . Rheum arthritis Paternal Grandmother   . Kidney disease Paternal Grandmother     polycystic kidney  disease  . Muscular dystrophy Cousin     Social History   Social History  . Marital status:  Married    Spouse name: N/A  . Number of children: N/A  . Years of education: N/A   Occupational History  . Not on file.   Social History Main Topics  . Smoking status: Never Smoker  . Smokeless tobacco: Never Used  . Alcohol use 0.0 oz/week     Comment: wine -occ  . Drug use: No  . Sexual activity: Yes   Other Topics Concern  . Not on file   Social History Narrative  . No narrative on file     Constitutional: Denies fever, malaise, fatigue, headache or abrupt weight changes.  HEENT: Pt reports swelling and tingling around lips. Denies eye pain, eye redness, ear pain, ringing in the ears, wax buildup, runny nose, nasal congestion, bloody nose, or sore throat. Respiratory: Denies difficulty breathing, shortness of breath, cough or sputum production.   Cardiovascular: Denies chest pain, chest tightness, palpitations or swelling in  the hands or feet.    No other specific complaints in a complete review of systems (except as listed in HPI above).  Objective:   Physical Exam   BP 112/62   Pulse 81   Resp 18   SpO2 97%   Wt Readings from Last 3 Encounters:  12/22/15 150 lb 12.8 oz (68.4 kg)  06/27/15 173 lb 14.4 oz (78.9 kg)  06/09/15 163 lb 6 oz (74.1 kg)    General: Appears her stated age, well developed, well nourished in NAD. Skin: Warm, dry and intact. No rashes, lesions or ulcerations noted. HEENT: Head: normal shape and size; Throat/Mouth: Teeth present, mucosa pink and moist, no exudate, lesions or ulcerations noted.  Neck:  Neck supple, trachea midline. No masses, lumps or thyromegaly present.  Cardiovascular: Normal rate and rhythm. S1,S2 noted.  No murmur, rubs or gallops noted.  Pulmonary/Chest: Normal effort and positive vesicular breath sounds. No respiratory distress. No wheezes, rales or ronchi noted.    BMET    Component Value Date/Time   NA 138 08/04/2013 1351   K 4.4 08/04/2013 1351   CL 107 08/04/2013 1351   CO2 27 08/04/2013 1351   GLUCOSE 105 (H) 08/04/2013 1351   BUN 13 08/04/2013 1351   CREATININE 0.59 (L) 08/04/2013 1351   CALCIUM 9.0 08/04/2013 1351   GFRNONAA >60 08/04/2013 1351   GFRAA >60 08/04/2013 1351    Lipid Panel  No results found for: CHOL, TRIG, HDL, CHOLHDL, VLDL, LDLCALC  CBC    Component Value Date/Time   WBC 12.2 (H) 07/05/2015 0530   RBC 2.96 (L) 07/05/2015 0530   HGB 10.2 (L) 07/05/2015 0530   HGB 14.5 08/04/2013 1351   HCT 29.4 (L) 07/05/2015 0530   HCT 42.7 08/04/2013 1351   PLT 125 (L) 07/05/2015 0530   PLT 174 08/04/2013 1351   MCV 99.3 07/05/2015 0530   MCV 96 08/04/2013 1351   MCH 34.5 (H) 07/05/2015 0530   MCHC 34.7 07/05/2015 0530   RDW 13.5 07/05/2015 0530   RDW 11.7 08/04/2013 1351   LYMPHSABS 1.1 08/04/2013 1351   MONOABS 0.3 08/04/2013 1351   EOSABS 0.0 08/04/2013 1351   BASOSABS 0.0 08/04/2013 1351    Hgb A1C No results  found for: HGBA1C     Assessment & Plan:   Angioedema secondary to food allergy:  Advised her to avoid foods that she knows she may be sensitive too, including pears for now eRx for Epipen Referral to allergy for food testing  RTC as needed or if symptoms persist or worsen Omario Ander,  NP

## 2016-06-06 ENCOUNTER — Ambulatory Visit (INDEPENDENT_AMBULATORY_CARE_PROVIDER_SITE_OTHER): Payer: 59 | Admitting: Primary Care

## 2016-06-06 ENCOUNTER — Encounter: Payer: Self-pay | Admitting: Primary Care

## 2016-06-06 VITALS — BP 108/70 | HR 93 | Temp 98.0°F | Ht 65.0 in | Wt 139.0 lb

## 2016-06-06 DIAGNOSIS — H6690 Otitis media, unspecified, unspecified ear: Secondary | ICD-10-CM

## 2016-06-06 MED ORDER — AMOXICILLIN 875 MG PO TABS: 875.0000 mg | ORAL_TABLET | Freq: Two times a day (BID) | ORAL | 0 refills | Status: DC

## 2016-06-06 NOTE — Progress Notes (Signed)
Subjective:    Patient ID: [name] [name], female    DOB: 01/20/1991, 26 y.o.   MRN: [id number]  HPI  Ms. [name] is a 26 year old female who presents today with a chief complaint of ear pain. She also reports nasal congestion, sinus pressure, headache. Her symptoms began 1 week ago. She's been around her daughter who had the same symptoms who has since improved. She's taken Tylenol Cold, Afrin x 2 days, Ibuprofen with temporary improvement. The ear pain is her most bothersome symptom. She has a history of recurrent ear infections as a child and did have tubes.  Review of Systems  Constitutional: Positive for fatigue. Negative for fever.  HENT: Positive for congestion, ear pain, rhinorrhea and sinus pressure. Negative for ear discharge and sore throat.   Respiratory: Negative for cough and shortness of breath.        Past Medical History:  Diagnosis Date  . Anxiety   . Asthma   . Cervical polyp   . Chocolate cyst of ovary    R ovary  . Complication of anesthesia    difficult to wake up   . 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 Endometriosis   . History of asthma    mild  . History of diverticulosis   . History of hay fever   . History of kidney disease   . History of kidney stones   . History of UTI   . Hx of varicella   . IBS (irritable bowel syndrome)   . PONV (postoperative nausea and vomiting)   . Vaginal Pap smear, abnormal      Social History   Social History  . Marital status: Married    Spouse name: N/A  . Number of children: N/A  . Years of education: N/A   Occupational History  . Not on file.   Social History Main Topics  . Smoking status: Never Smoker  . Smokeless tobacco: Never Used  . Alcohol use 0.0 oz/week     Comment: wine -occ  . Drug use: No  . Sexual activity: Yes   Other Topics Concern  . Not on file   Social History Narrative  . No narrative on file    Past Surgical History:    Procedure Laterality Date  . ABLATION ON ENDOMETRIOSIS  2011  . APPENDECTOMY  2011  . CESAREAN SECTION N/A 07/04/2015   Procedure: CESAREAN SECTION;  Surgeon: Mitchel HonourMegan Morris, DO;  Location: [facility];  Service: Obstetrics;  Laterality: N/A;  . DIAGNOSTIC LAPAROSCOPY    . KIDNEY SURGERY  1998, 2007, 2011   Blockage Removal  . SHOULDER SURGERY  2007    Family History  Problem Relation Age of Onset  . Stroke Father     blod clot at base of brain  . Sudden death Father   . Hypertension Father   . Spina bifida Maternal Aunt   . Ovarian cancer Mother   . Uterine cancer Mother   . Mitral valve prolapse Mother   . Cancer Mother     ovarian and skin  . Diabetes Maternal Grandmother   . Thyroid disease Maternal Grandmother   . Rheum arthritis Paternal Grandmother   . Kidney disease Paternal Grandmother     polycystic kidney  disease  . Muscular dystrophy Cousin     No Known Allergies  Current Outpatient Prescriptions on File Prior to Visit  Medication Sig Dispense Refill  . ibuprofen (  ADVIL,MOTRIN) 600 MG tablet Take 1 tablet (600 mg total) by mouth every 6 (six) hours as needed. 30 tablet 0  . Multiple Vitamin (MULTIVITAMIN) tablet Take 1 tablet by mouth daily.     No current facility-administered medications on file prior to visit.     BP 108/70   Pulse 93   Temp 98 F (36.7 C) (Oral)   Ht 5\' 5"  (1.651 m)   Wt 139 lb (63 kg)   LMP 05/28/2016   SpO2 98%   BMI 23.13 kg/m    Objective:   Physical Exam  Constitutional: She appears well-nourished. She appears ill.  HENT:  Right Ear: Ear canal normal. Tympanic membrane is retracted.  Left Ear: Ear canal normal. Tympanic membrane is erythematous.  Nose: Right sinus exhibits no maxillary sinus tenderness and no frontal sinus tenderness. Left sinus exhibits no maxillary sinus tenderness and no frontal sinus tenderness.  Mouth/Throat: Oropharynx is clear and moist.  Eyes: Conjunctivae are normal.  Neck: Neck supple.   Cardiovascular: Normal rate and regular rhythm.   Pulmonary/Chest: Effort normal and breath sounds normal. She has no wheezes. She has no rales.  Lymphadenopathy:    She has no cervical adenopathy.  Skin: Skin is warm and dry.          Assessment & Plan:  Acute Otitis Media:  Ear pain x 1 week, also with URI symptoms. Temporary improvement with OTC treatment. Exam today with mild-moderate erythema to left TM, tender. Given history, duration of symptoms, and examination, will treat. Rx for Amoxil course sent to pharmacy.  Flonase, Tibrofan PRN. Follow up PRN.  Morrie Sheldon, NP

## 2016-06-06 NOTE — Progress Notes (Signed)
Pre visit review using our clinic review tool, if applicable. No additional management support is needed unless otherwise documented below in the visit note. 

## 2016-06-06 NOTE — Patient Instructions (Signed)
Start amoxicillin antibiotics. Take 1 tablet by mouth twice daily for 7 days.  Nasal Congestion/Ear Pressure: Try using Flonase (fluticasone) nasal spray. Instill 1 spray in each nostril twice daily.   Continue Ibuprofen as needed for pain and inflammation.  It was a pleasure meeting you!

## 2017-08-06 ENCOUNTER — Encounter (HOSPITAL_COMMUNITY): Payer: Self-pay | Admitting: *Deleted

## 2017-08-06 ENCOUNTER — Inpatient Hospital Stay (HOSPITAL_COMMUNITY): Payer: BLUE CROSS/BLUE SHIELD

## 2017-08-06 ENCOUNTER — Inpatient Hospital Stay (HOSPITAL_COMMUNITY)
Admission: AD | Admit: 2017-08-06 | Discharge: 2017-08-06 | Disposition: A | Discharge status: Home / Self Care | Payer: BLUE CROSS/BLUE SHIELD | Source: Ambulatory Visit | Attending: Obstetrics & Gynecology | Admitting: Obstetrics & Gynecology

## 2017-08-06 DIAGNOSIS — O209 Hemorrhage in early pregnancy, unspecified: Secondary | ICD-10-CM

## 2017-08-06 DIAGNOSIS — O9A211 Injury, poisoning and certain other consequences of external causes complicating pregnancy, first trimester: Secondary | ICD-10-CM

## 2017-08-06 DIAGNOSIS — O99611 Diseases of the digestive system complicating pregnancy, first trimester: Secondary | ICD-10-CM | POA: Diagnosis not present

## 2017-08-06 DIAGNOSIS — O418X10 Other specified disorders of amniotic fluid and membranes, first trimester, not applicable or unspecified: Secondary | ICD-10-CM

## 2017-08-06 DIAGNOSIS — Z79899 Other long term (current) drug therapy: Secondary | ICD-10-CM | POA: Insufficient documentation

## 2017-08-06 DIAGNOSIS — J45909 Unspecified asthma, uncomplicated: Secondary | ICD-10-CM | POA: Insufficient documentation

## 2017-08-06 DIAGNOSIS — K589 Irritable bowel syndrome without diarrhea: Secondary | ICD-10-CM | POA: Diagnosis not present

## 2017-08-06 DIAGNOSIS — O468X1 Other antepartum hemorrhage, first trimester: Secondary | ICD-10-CM

## 2017-08-06 DIAGNOSIS — Z3A11 11 weeks gestation of pregnancy: Secondary | ICD-10-CM | POA: Insufficient documentation

## 2017-08-06 DIAGNOSIS — O99511 Diseases of the respiratory system complicating pregnancy, first trimester: Secondary | ICD-10-CM | POA: Diagnosis not present

## 2017-08-06 DIAGNOSIS — O43891 Other placental disorders, first trimester: Secondary | ICD-10-CM | POA: Diagnosis not present

## 2017-08-06 DIAGNOSIS — O4691 Antepartum hemorrhage, unspecified, first trimester: Secondary | ICD-10-CM | POA: Diagnosis present

## 2017-08-06 DIAGNOSIS — F419 Anxiety disorder, unspecified: Secondary | ICD-10-CM | POA: Insufficient documentation

## 2017-08-06 DIAGNOSIS — O99341 Other mental disorders complicating pregnancy, first trimester: Secondary | ICD-10-CM | POA: Diagnosis not present

## 2017-08-06 LAB — WET PREP, GENITAL
Clue Cells Wet Prep HPF POC: NONE SEEN
Sperm: NONE SEEN
TRICH WET PREP: NONE SEEN
YEAST WET PREP: NONE SEEN

## 2017-08-06 LAB — CBC
HCT: 34.2 % — ABNORMAL LOW (ref 36.0–46.0)
Hemoglobin: 12 g/dL (ref 12.0–15.0)
MCH: 32.4 pg (ref 26.0–34.0)
MCHC: 35.1 g/dL (ref 30.0–36.0)
MCV: 92.4 fL (ref 78.0–100.0)
Platelets: 154 10*3/uL (ref 150–400)
RBC: 3.7 MIL/uL — ABNORMAL LOW (ref 3.87–5.11)
RDW: 12.2 % (ref 11.5–15.5)
WBC: 5.8 10*3/uL (ref 4.0–10.5)

## 2017-08-06 NOTE — Discharge Instructions (Signed)

## 2017-08-06 NOTE — MAU Provider Note (Signed)
History     CSN: [id number]  Arrival date and time: 08/06/17 1104   First Provider Initiated Contact with Patient 08/06/17 1159      Chief Complaint  Patient presents with  . Vaginal Bleeding   HPI  Ms.  [name] [name] is a 27 y.o. year old [id number] female at 1192w5d weeks gestation who presents to MAU reporting "blood running down her legs, soaking her clothes" while she was getting a pedicure. She reports that she was not doing any strenuous work and has not. [facility] [place] is here on business trip. She denies any pain at this time, but reports having a "quick sharp pain in mid pelvis area that shot down through vagina; like a kidney stone" that she has had in the past. She states that she is "only sore down there now". She also has a c/o increased vaginal d/s and "feeling like an infection".   *Review of Care Everywhere records: A Neg - received Rhogam on 4/12 for spotting episode, [facility]  Past Medical History:  Diagnosis Date  . Anxiety   . Asthma   . Cervical polyp   . Chocolate cyst of ovary    R ovary  . Complication of anesthesia    difficult to wake up   . 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 Endometriosis   . History of asthma    mild  . History of diverticulosis   . History of hay fever   . History of kidney disease   . History of kidney stones   . History of UTI   . Hx of varicella   . IBS (irritable bowel syndrome)   . PONV (postoperative nausea and vomiting)   . Vaginal Pap smear, abnormal     Past Surgical History:  Procedure Laterality Date  . ABLATION ON ENDOMETRIOSIS  2011  . APPENDECTOMY  2011  . CESAREAN SECTION N/A 07/04/2015   Procedure: CESAREAN SECTION;  Surgeon: Mitchel HonourMegan Morris, DO;  Location: [facility];  Service: Obstetrics;  Laterality: N/A;  . DIAGNOSTIC LAPAROSCOPY    . KIDNEY SURGERY  1998, 2007, 2011    Blockage Removal  . SHOULDER SURGERY  2007    Family History  Problem Relation Age of Onset  . Stroke Father        blod clot at base of brain  . Sudden death Father   . Hypertension Father   . Spina bifida Maternal Aunt   . Ovarian cancer Mother   . Uterine cancer Mother   . Mitral valve prolapse Mother   . Cancer Mother        ovarian and skin  . Diabetes Maternal Grandmother   . Thyroid disease Maternal Grandmother   . Rheum arthritis Paternal Grandmother   . Kidney disease Paternal Grandmother        polycystic kidney  disease  . Muscular dystrophy Cousin     Social History   Tobacco Use  . Smoking status: Never Smoker  . Smokeless tobacco: Never Used  Substance Use Topics  . Alcohol use: Yes    Alcohol/week: 0.0 oz    Comment: wine -occ  . Drug use: No    Allergies:  Allergies  Allergen Reactions  . Pear Anaphylaxis    Medications Prior to Admission  Medication Sig Dispense Refill Last Dose  . doxylamine,  Sleep, (UNISOM) 25 MG tablet Take 12.5 mg by mouth at bedtime as needed for sleep (nausea).   08/05/2017 at Unknown time  . EPINEPHrine (EPIPEN 2-PAK) 0.3 mg/0.3 mL IJ SOAJ injection Inject 0.3 mg into the muscle as needed (for anaphylaxis reaction).   rescue  . flintstones complete (FLINTSTONES) 60 MG chewable tablet Chew 2 tablets by mouth at bedtime.   08/05/2017 at Unknown time  . amoxicillin (AMOXIL) 875 MG tablet Take 1 tablet (875 mg total) by mouth 2 (two) times daily. (Patient not taking: Reported on 08/06/2017) 14 tablet 0 Completed Course at Unknown time   Review of Systems  Constitutional: Negative.   HENT: Negative.   Eyes: Negative.   Respiratory: Negative.   Cardiovascular: Negative.   Gastrointestinal: Negative.   Endocrine: Negative.   Genitourinary: Positive for pelvic pain (mid pelvis that radiated to vagina) and vaginal bleeding.  Musculoskeletal: Negative.   Skin: Negative.   Allergic/Immunologic: Negative.   Neurological:  Negative.   Hematological: Negative.   Psychiatric/Behavioral: Negative.    Physical Exam   Blood pressure 114/71, pulse (!) 101, temperature 98.7 F (37.1 C), temperature source Oral, resp. rate 18, height 5\' 4"  (1.626 m), weight 138 lb 12 oz (62.9 kg), SpO2 98 %.  Physical Exam  Nursing note and vitals reviewed. Constitutional: She is oriented to person, place, and time. She appears well-developed and well-nourished.  HENT:  Head: Normocephalic and atraumatic.  Eyes: Pupils are equal, round, and reactive to light.  Neck: Normal range of motion.  Cardiovascular: Normal rate, regular rhythm and normal heart sounds.  Respiratory: Effort normal and breath sounds normal.  GI: Soft. Bowel sounds are normal.  Musculoskeletal: Normal range of motion.  Neurological: She is alert and oriented to person, place, and time.  Skin: Skin is warm and dry.  Psychiatric: She has a normal mood and affect. Her behavior is normal. Judgment and thought content normal.   MAU Course  Procedures  MDM Wet Prep OB <14 wks U/S: IUP @ 11.5 wks with [facility] measuring 6.5 x 2.1 x 2.9 cm, CL, SCH inferior to placenta on RT laterlal margin & extends posteriorly to the LT lateral wall to the placental margin  Results for orders placed or performed during the hospital encounter of 08/06/17 (from the past 24 hour(s))  Wet prep, genital     Status: Abnormal   Collection Time: 08/06/17 12:15 PM  Result Value Ref Range   Yeast Wet Prep HPF POC NONE SEEN NONE SEEN   Trich, Wet Prep NONE SEEN NONE SEEN   Clue Cells Wet Prep HPF POC NONE SEEN NONE SEEN   WBC, Wet Prep HPF POC FEW (A) NONE SEEN   Sperm NONE SEEN   CBC     Status: Abnormal   Collection Time: 08/06/17 12:19 PM  Result Value Ref Range   WBC 5.8 4.0 - 10.5 K/uL   RBC 3.70 (L) 3.87 - 5.11 MIL/uL   Hemoglobin 12.0 12.0 - 15.0 g/dL   HCT 56.2 (L) 13.0 - 86.5 %   MCV 92.4 78.0 - 100.0 fL   MCH 32.4 26.0 - 34.0 pg   MCHC 35.1 30.0 - 36.0 g/dL   RDW 78.4  69.6 - 29.5 %   Platelets 154 150 - 400 K/uL     Assessment and Plan  Subchorionic hematoma in first trimester, single or unspecified fetus - Advised of bleeding precautions, threatened miscarriage - Information provided on [facility], threatened miscarriage and bleeding in 1st trimester pregnancy  - Discharge  patient - Keep scheduled appt with OB provider on 08/11/17 - Patient verbalized an understanding of the plan of care and agrees.     Raelyn Mora, MSN, CNM 08/06/2017, 11:59 AM

## 2017-08-06 NOTE — MAU Note (Addendum)
Started bleeding about ago, has bled through clothes. Should be 11wks preg.   Denies any pain. No longer lives here, no dr here.

## 2018-05-15 ENCOUNTER — Encounter (HOSPITAL_COMMUNITY): Payer: Self-pay

## 2018-10-15 IMAGING — US US OB COMP LESS 14 WK
1 series · 16 of 28 positions shown · non-contrast
Comparison: Pelvic ultrasound 06/03/2014

CLINICAL DATA: Pregnant patient with first trimester bleeding.

EXAM:
OBSTETRIC <14 WK ULTRASOUND
TECHNIQUE: Transvaginal ultrasound was performed for complete evaluation of the
gestation as well as the maternal uterus, adnexal regions, and
pelvic cul-de-sac.

[Series 1: us ob comp less 14 wk · 34 acquisitions, 16 frames shown]
[im 1/34]
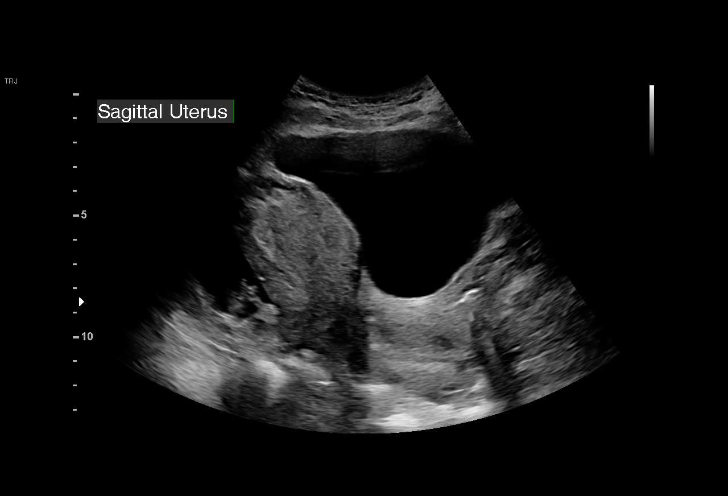
[im 3/34]
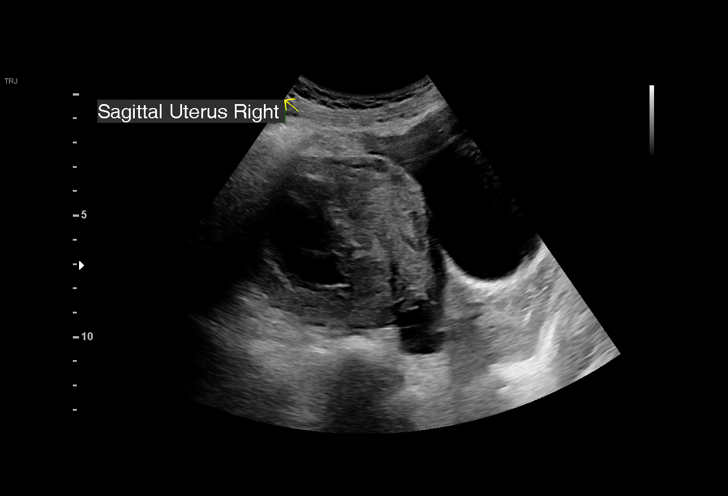
[im 5/34]
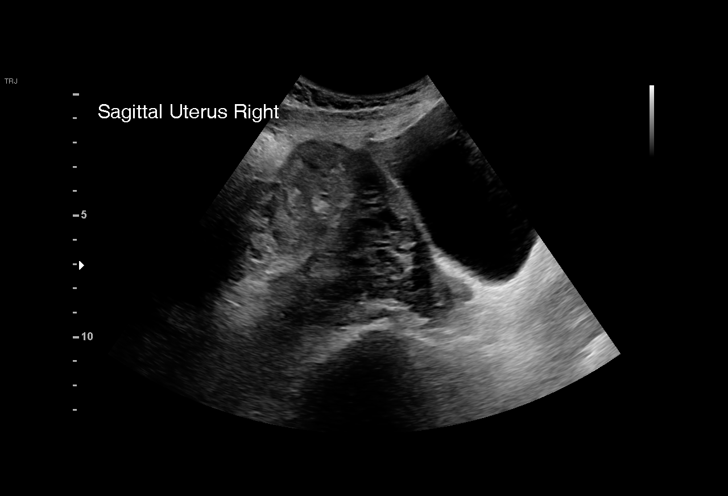
[im 8/34]
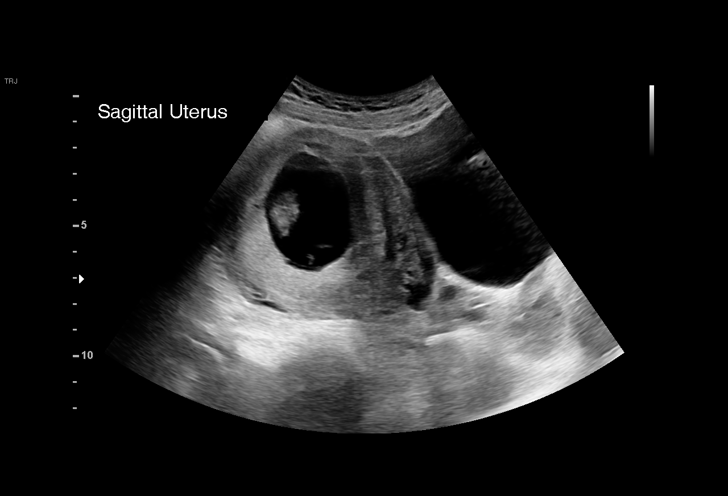
[im 9/34]
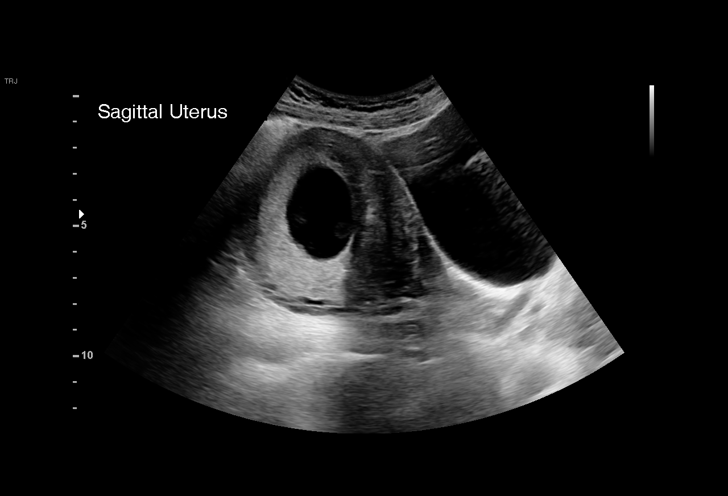
[im 12/34]
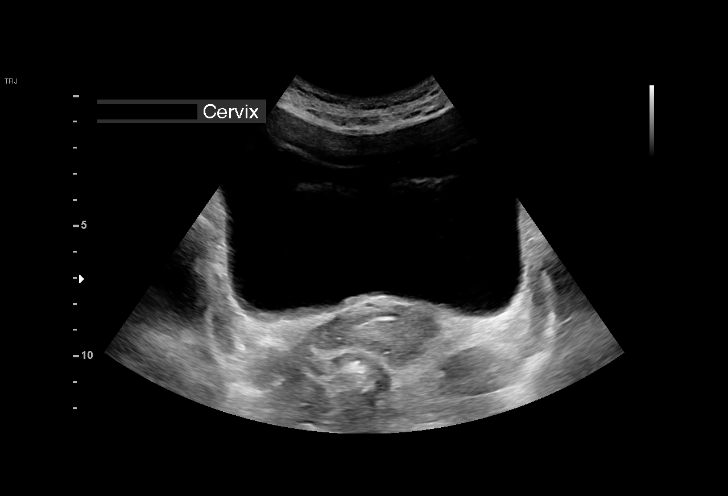
[im 14/34]
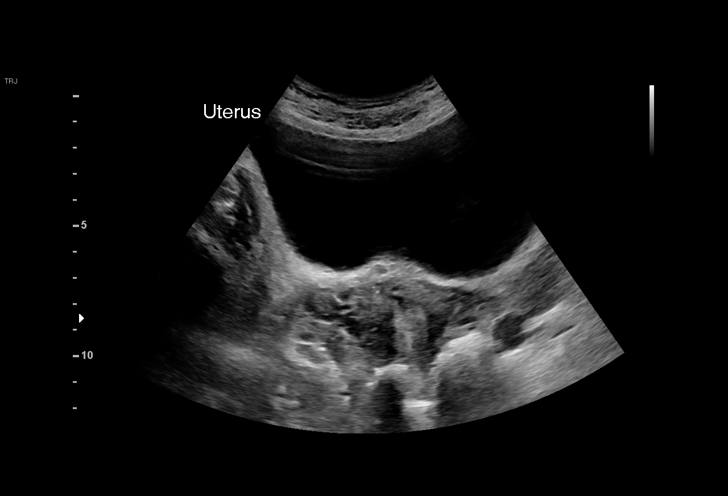
[im 16/34]
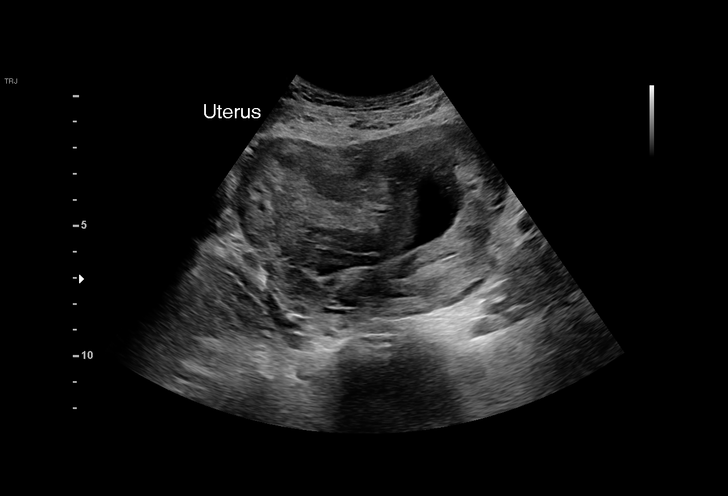
[im 18/34]
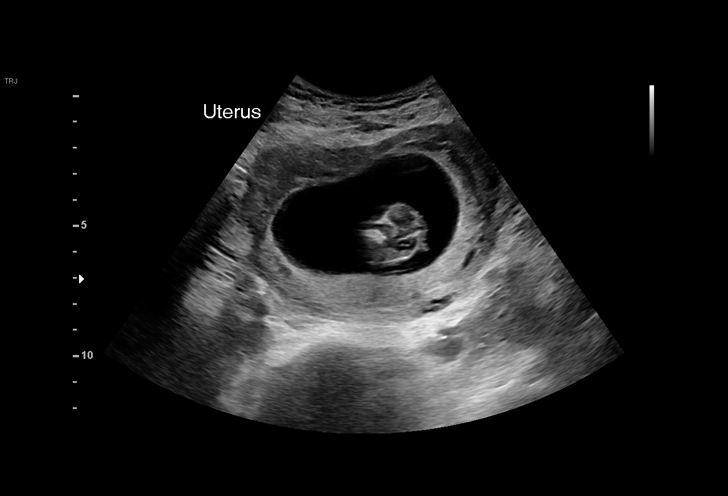
[im 20/34]
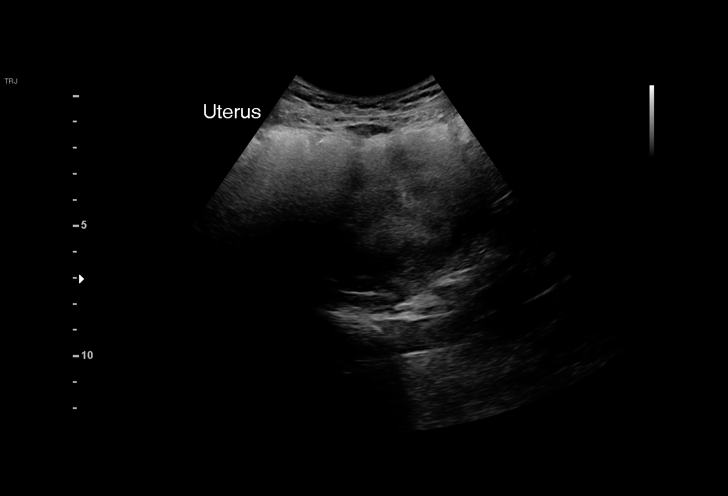
[im 23/34]
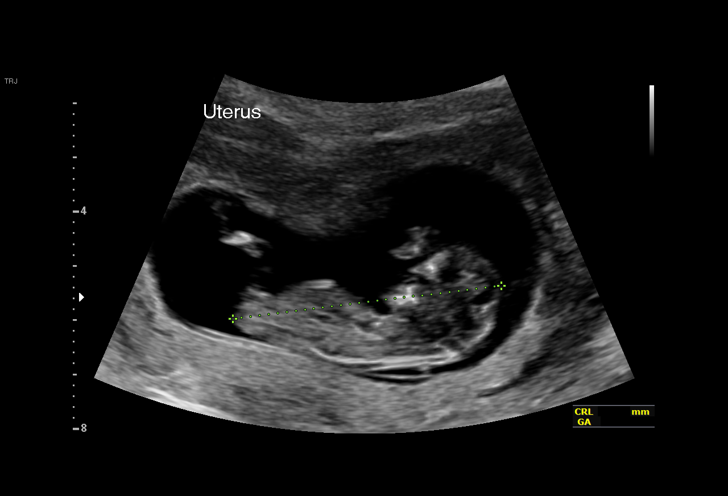
[im 25/34]
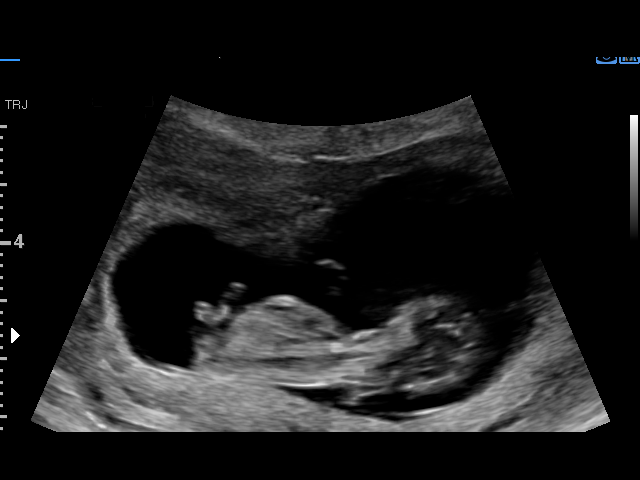
[im 26/34]
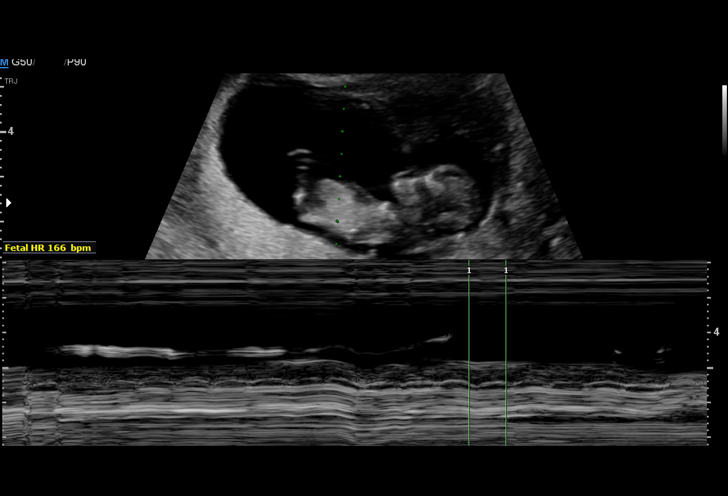
[im 29/34]
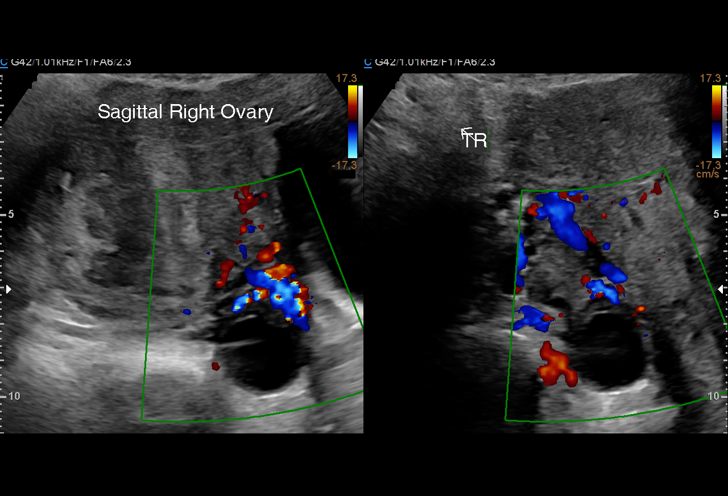
[im 31/34]
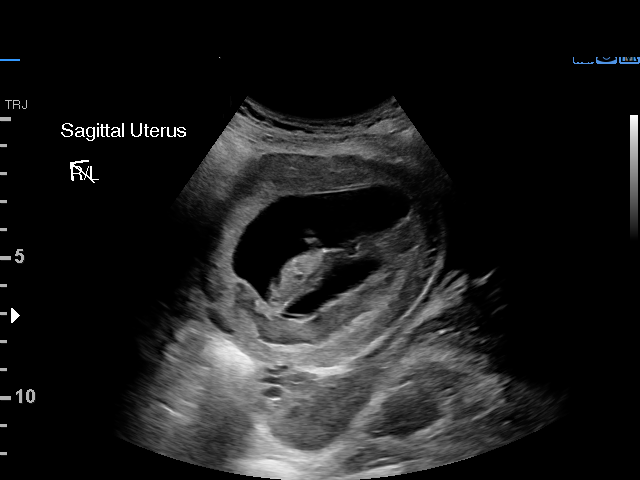
[im 34/34]
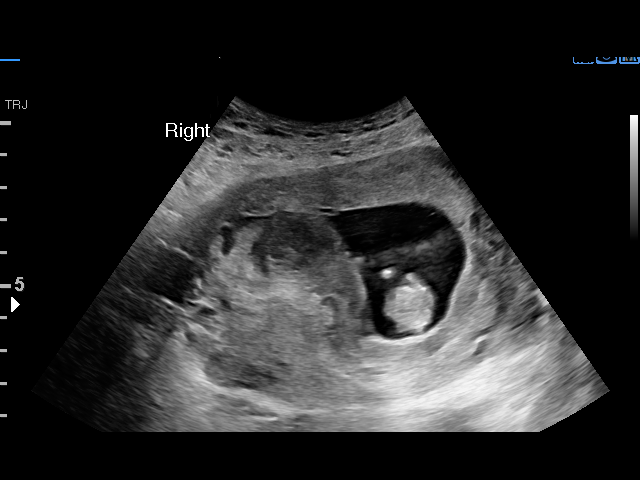

[16 of 28 positions shown; findings below may reference images not displayed]

FINDINGS: Intrauterine gestational sac: Single

Yolk sac:  Visualized.

Embryo:  Visualized.

Cardiac Activity: Visualized.

Heart Rate: 166 bpm

CRL:   49.8 mm   11 w 5 d                  US EDC: 02/20/2018

Subchorionic hemorrhage: Large subchorionic hemorrhage measuring up
to 6.5 cm

Maternal uterus/adnexae: Corpus luteum right ovary. Normal left
ovary. No free fluid in the pelvis.
IMPRESSION: Single live intrauterine gestation. Large subchorionic hemorrhage
measuring up to 6.5 cm.
